# Patient Record
Sex: Male | Born: 1985
Health system: Southern US, Community
[De-identification: ages and names within clinical notes are randomized; demographics above are authoritative.]

## PROBLEM LIST (undated history)

## (undated) DIAGNOSIS — F429 Obsessive-compulsive disorder, unspecified: Secondary | ICD-10-CM

## (undated) HISTORY — DX: Obsessive-compulsive disorder, unspecified: F42.9

## (undated) HISTORY — PX: EYE MUSCLE SURGERY: SHX370

## (undated) HISTORY — PX: WISDOM TOOTH EXTRACTION: SHX21

---

## 2013-10-26 LAB — TSH: TSH: 1.1 u[IU]/mL (ref <=5.90)

## 2013-10-26 LAB — LIPID PANEL
Cholesterol: 198 mg/dL (ref 0–200)
HDL: 50 mg/dL (ref 35–70)
LDL Cholesterol: 130 mg/dL
TRIGLYCERIDES: 92 mg/dL (ref 40–160)

## 2013-10-26 LAB — CBC AND DIFFERENTIAL: HEMOGLOBIN: 15.9 g/dL (ref 13.5–17.5)

## 2014-09-15 ENCOUNTER — Encounter: Payer: Self-pay | Admitting: Internal Medicine

## 2014-09-15 ENCOUNTER — Other Ambulatory Visit: Payer: Self-pay | Admitting: Internal Medicine

## 2014-11-19 ENCOUNTER — Encounter: Payer: Self-pay | Admitting: Internal Medicine

## 2014-11-19 DIAGNOSIS — E7800 Pure hypercholesterolemia, unspecified: Secondary | ICD-10-CM | POA: Insufficient documentation

## 2014-11-19 DIAGNOSIS — Z8659 Personal history of other mental and behavioral disorders: Secondary | ICD-10-CM | POA: Insufficient documentation

## 2015-03-02 ENCOUNTER — Other Ambulatory Visit: Payer: Self-pay | Admitting: Internal Medicine

## 2015-03-03 NOTE — Telephone Encounter (Signed)
pts coming in on 06/04/15 for cpe.

## 2015-03-26 ENCOUNTER — Other Ambulatory Visit: Payer: Self-pay | Admitting: Internal Medicine

## 2015-03-26 ENCOUNTER — Telehealth: Payer: Self-pay

## 2015-03-26 NOTE — Telephone Encounter (Signed)
Left VM needing refill Zoloft 90 to [facility]. Per Asencion Partridge this has been sent in on Mar 02 2015. I advised patient and he said he must have been confused and he will call them and see why he has not gotten these. He will also call back if there is a reason to call in to local.

## 2015-04-18 ENCOUNTER — Encounter: Payer: Self-pay | Admitting: Unknown Physician Specialty

## 2015-04-18 ENCOUNTER — Other Ambulatory Visit: Payer: Self-pay | Admitting: Unknown Physician Specialty

## 2015-04-18 ENCOUNTER — Ambulatory Visit (INDEPENDENT_AMBULATORY_CARE_PROVIDER_SITE_OTHER): Payer: 59 | Admitting: Unknown Physician Specialty

## 2015-04-18 VITALS — BP 139/90 | HR 71 | Temp 98.1°F | Ht 71.0 in | Wt 225.2 lb

## 2015-04-18 DIAGNOSIS — L03011 Cellulitis of right finger: Secondary | ICD-10-CM

## 2015-04-18 DIAGNOSIS — R05 Cough: Secondary | ICD-10-CM | POA: Diagnosis not present

## 2015-04-18 DIAGNOSIS — R059 Cough, unspecified: Secondary | ICD-10-CM

## 2015-04-18 DIAGNOSIS — J069 Acute upper respiratory infection, unspecified: Secondary | ICD-10-CM | POA: Diagnosis not present

## 2015-04-18 NOTE — Progress Notes (Signed)
BP 139/90 mmHg  Pulse 71  Temp(Src) 98.1 F (36.7 C)  Ht 5\' 11"  (1.803 m)  Wt 225 lb 3.2 oz (102.15 kg)  BMI 31.42 kg/m2  SpO2 98%   Subjective:    Patient ID: Kevin Spears, male    DOB: 20-Sep-1985, 30 y.o.   MRN: [id number]  HPI: Kevin Spears is a 30 y.o. male  No chief complaint on file.  URI  This is a new problem. The current episode started today. There has been no fever. Associated symptoms include congestion, headaches, rhinorrhea, sneezing and a sore throat. Pertinent negatives include no chest pain, coughing, neck pain, sinus pain or wheezing. He has tried nothing for the symptoms. The treatment provided no relief.   Paronychia 2 days of redness and swelling of left index finger.  Started Doxycycline 2 days ago.     Relevant past medical, surgical, family and social history reviewed and updated as indicated. Interim medical history since our last visit reviewed. Allergies and medications reviewed and updated.  Review of Systems  HENT: Positive for congestion, rhinorrhea, sneezing and sore throat.   Respiratory: Negative for cough and wheezing.   Cardiovascular: Negative for chest pain.  Musculoskeletal: Negative for neck pain.  Neurological: Positive for headaches.    Per HPI unless specifically indicated above     Objective:    BP 139/90 mmHg  Pulse 71  Temp(Src) 98.1 F (36.7 C)  Ht 5\' 11"  (1.803 m)  Wt 225 lb 3.2 oz (102.15 kg)  BMI 31.42 kg/m2  SpO2 98%  Wt Readings from Last 3 Encounters:  04/18/15 225 lb 3.2 oz (102.15 kg)    Physical Exam  Constitutional: He is oriented to person, place, and time. He appears well-developed and well-nourished. No distress.  HENT:  Head: Normocephalic and atraumatic.  Right Ear: Tympanic membrane and ear canal normal.  Left Ear: Tympanic membrane and ear canal normal.  Nose: Rhinorrhea present. Right sinus exhibits no maxillary sinus tenderness and no frontal sinus tenderness. Left sinus exhibits no  maxillary sinus tenderness and no frontal sinus tenderness.  Mouth/Throat: Uvula is midline. Posterior oropharyngeal edema present.  Eyes: Conjunctivae and lids are normal. Right eye exhibits no discharge. Left eye exhibits no discharge. No scleral icterus.  Neck: Neck supple.  Cardiovascular: Normal rate, regular rhythm and normal heart sounds.   Pulmonary/Chest: Effort normal and breath sounds normal. No respiratory distress.  Abdominal: Normal appearance. There is no splenomegaly or hepatomegaly.  Musculoskeletal: Normal range of motion.  Neurological: He is alert and oriented to person, place, and time.  Skin: Skin is warm, dry and intact. No rash noted. No pallor.  Psychiatric: He has a normal mood and affect. His behavior is normal. Judgment and thought content normal.  Nursing note and vitals reviewed.  Flu is negative  Right index finger with redness and swelling around nail.  Area infiltrated with Lidocaine and small incision made.  Small amount of pustular material expressed.    Results for orders placed or performed in visit on 09/15/14  CBC and differential  Result Value Ref Range   Hemoglobin 15.9 13.5 - 17.5 g/dL  Lipid panel  Result Value Ref Range   Triglycerides 92 40 - 160 mg/dL   Cholesterol 119198 0 - 147200 mg/dL   HDL 50 35 - 70 mg/dL   LDL Cholesterol 829130 mg/dL  TSH  Result Value Ref Range   TSH 1.10 .41 - 5.90 uIU/mL      Assessment & Plan:  Problem List Items Addressed This Visit    None    Visit Diagnoses    Cough    -  Primary    Relevant Orders    Influenza A & B (STAT)    URI (upper respiratory infection)        Supportive care    Paronychia, right        Continue Doxycycline for 2 days and DC if finger improving        Follow up plan: Return if symptoms worsen or fail to improve.

## 2015-04-19 LAB — VERITOR FLU A/B WAIVED
INFLUENZA A: NEGATIVE
Influenza B: NEGATIVE

## 2015-05-26 ENCOUNTER — Other Ambulatory Visit: Payer: Self-pay | Admitting: Internal Medicine

## 2015-06-04 ENCOUNTER — Ambulatory Visit (INDEPENDENT_AMBULATORY_CARE_PROVIDER_SITE_OTHER): Payer: 59 | Admitting: Internal Medicine

## 2015-06-04 ENCOUNTER — Encounter: Payer: Self-pay | Admitting: Internal Medicine

## 2015-06-04 VITALS — BP 120/84 | HR 69 | Temp 98.1°F | Resp 16 | Ht 71.0 in | Wt 224.8 lb

## 2015-06-04 DIAGNOSIS — Z0000 Encounter for general adult medical examination without abnormal findings: Secondary | ICD-10-CM

## 2015-06-04 DIAGNOSIS — Z8659 Personal history of other mental and behavioral disorders: Secondary | ICD-10-CM | POA: Diagnosis not present

## 2015-06-04 DIAGNOSIS — E7800 Pure hypercholesterolemia, unspecified: Secondary | ICD-10-CM

## 2015-06-04 LAB — POCT URINALYSIS DIPSTICK
Bilirubin, UA: NEGATIVE
Blood, UA: NEGATIVE
Glucose, UA: NEGATIVE
Ketones, UA: NEGATIVE
Leukocytes, UA: NEGATIVE
Nitrite, UA: NEGATIVE
PH UA: 5
Protein, UA: NEGATIVE
Spec Grav, UA: 1.01
Urobilinogen, UA: 0.2

## 2015-06-04 NOTE — Patient Instructions (Addendum)
Fat and Cholesterol Restricted Diet High levels of fat and cholesterol in your blood may lead to various health problems, such as diseases of the heart, blood vessels, gallbladder, liver, and pancreas. Fats are concentrated sources of energy that come in various forms. Certain types of fat, including saturated fat, may be harmful in excess. Cholesterol is a substance needed by your body in small amounts. Your body makes all the cholesterol it needs. Excess cholesterol comes from the food you eat. When you have high levels of cholesterol and saturated fat in your blood, health problems can develop because the excess fat and cholesterol will gather along the walls of your blood vessels, causing them to narrow. Choosing the right foods will help you control your intake of fat and cholesterol. This will help keep the levels of these substances in your blood within normal limits and reduce your risk of disease. WHAT IS MY PLAN? Your health care provider recommends that you:  Get no more than ______30____ % of the total calories in your daily diet from fat.  Limit your intake of saturated fat to less than __15____% of your total calories each day.  Limit the amount of cholesterol in your diet to less than ____NA_____mg per day. WHAT TYPES OF FAT SHOULD I CHOOSE?  Choose healthy fats more often. Choose monounsaturated and polyunsaturated fats, such as olive and canola oil, flaxseeds, walnuts, almonds, and seeds.  Eat more omega-3 fats. Good choices include salmon, mackerel, sardines, tuna, flaxseed oil, and ground flaxseeds. Aim to eat fish at least two times a week.  Limit saturated fats. Saturated fats are primarily found in animal products, such as meats, butter, and cream. Plant sources of saturated fats include palm oil, palm kernel oil, and coconut oil.  Avoid foods with partially hydrogenated oils in them. These contain trans fats. Examples of foods that contain trans fats are stick margarine,  some tub margarines, cookies, crackers, and other baked goods. WHAT GENERAL GUIDELINES DO I NEED TO FOLLOW? These guidelines for healthy eating will help you control your intake of fat and cholesterol:  Check food labels carefully to identify foods with trans fats or high amounts of saturated fat.  Fill one half of your plate with vegetables and green salads.  Fill one fourth of your plate with whole grains. Look for the word "whole" as the first word in the ingredient list.  Fill one fourth of your plate with lean protein foods.  Limit fruit to two servings a day. Choose fruit instead of juice.  Eat more foods that contain soluble fiber. Examples of foods that contain this type of fiber are apples, broccoli, carrots, beans, peas, and barley. Aim to get 20-30 g of fiber per day.  Eat more home-cooked food and less restaurant, buffet, and fast food.  Limit or avoid alcohol.  Limit foods high in starch and sugar.  Limit fried foods.  Cook foods using methods other than frying. Baking, boiling, grilling, and broiling are all great options.  Lose weight if you are overweight. Losing just 5-10% of your initial body weight can help your overall health and prevent diseases such as diabetes and heart disease. WHAT FOODS CAN I EAT? Grains Whole grains, such as whole wheat or whole grain breads, crackers, cereals, and pasta. Unsweetened oatmeal, bulgur, barley, quinoa, or brown rice. Corn or whole wheat flour tortillas. Vegetables Fresh or frozen vegetables (raw, steamed, roasted, or grilled). Green salads. Fruits All fresh, canned (in natural juice), or frozen fruits. Meat and  Other Protein Products Ground beef (85% or leaner), grass-fed beef, or beef trimmed of fat. Skinless chicken or turkey. Ground chicken or turkey. Pork trimmed of fat. All fish and seafood. Eggs. Dried beans, peas, or lentils. Unsalted nuts or seeds. Unsalted canned or dry beans. Dairy Low-fat dairy products, such as  skim or 1% milk, 2% or reduced-fat cheeses, low-fat ricotta or cottage cheese, or plain low-fat yogurt. Fats and Oils Tub margarines without trans fats. Light or reduced-fat mayonnaise and salad dressings. Avocado. Olive, canola, sesame, or safflower oils. Natural peanut or almond butter (choose ones without added sugar and oil). The items listed above may not be a complete list of recommended foods or beverages. Contact your dietitian for more options. WHAT FOODS ARE NOT RECOMMENDED? Grains White bread. White pasta. White rice. Cornbread. Bagels, pastries, and croissants. Crackers that contain trans fat. Vegetables White potatoes. Corn. Creamed or fried vegetables. Vegetables in a cheese sauce. Fruits Dried fruits. Canned fruit in light or heavy syrup. Fruit juice. Meat and Other Protein Products Fatty cuts of meat. Ribs, chicken wings, bacon, sausage, bologna, salami, chitterlings, fatback, hot dogs, bratwurst, and packaged luncheon meats. Liver and organ meats. Dairy Whole or 2% milk, cream, half-and-half, and cream cheese. Whole milk cheeses. Whole-fat or sweetened yogurt. Full-fat cheeses. Nondairy creamers and whipped toppings. Processed cheese, cheese spreads, or cheese curds. Sweets and Desserts Corn syrup, sugars, honey, and molasses. Candy. Jam and jelly. Syrup. Sweetened cereals. Cookies, pies, cakes, donuts, muffins, and ice cream. Fats and Oils Butter, stick margarine, lard, shortening, ghee, or bacon fat. Coconut, palm kernel, or palm oils. Beverages Alcohol. Sweetened drinks (such as sodas, lemonade, and fruit drinks or punches). The items listed above may not be a complete list of foods and beverages to avoid. Contact your dietitian for more information.   This information is not intended to replace advice given to you by your health care provider. Make sure you discuss any questions you have with your health care provider.   Document Released: 01/11/2005 Document Revised:  02/01/2014 Document Reviewed: 04/11/2013 Elsevier Interactive Patient Education 2016 Elsevier Inc.  

## 2015-06-04 NOTE — Progress Notes (Signed)
Date:  06/04/2015   Name:  Kevin Spears   DOB:  [birth year]   MRN:  [id number]   Chief Complaint: Annual Exam Kevin Spears is a 30 y.o. male who presents today for his Complete Annual Exam. He feels well. He reports exercising regularly doing cardio and weight lifting. He reports he is sleeping fairly well.   OCD - he is doing very well on Sertraline daily.  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  He has been able to concentrate and complete work in a timely fashion.  He would like to continue the medication.   Review of Systems  Constitutional: Negative for fever, chills and unexpected weight change.  HENT: Negative for hearing loss.   Eyes: Negative for visual disturbance.  Respiratory: Negative for cough, chest tightness and shortness of breath.   Cardiovascular: Negative for chest pain, palpitations and leg swelling.  Gastrointestinal: Negative for abdominal pain, diarrhea, constipation and blood in stool.  Genitourinary: Negative for dysuria, urgency, hematuria, scrotal swelling and testicular pain.  Musculoskeletal: Negative for arthralgias.  Skin: Negative for color change and rash.  Allergic/Immunologic: Positive for environmental allergies.  Neurological: Negative for dizziness, tremors, weakness, numbness and headaches.  Hematological: Negative for adenopathy.  Psychiatric/Behavioral: Negative for sleep disturbance, dysphoric mood and decreased concentration.    Patient Active Problem List   Diagnosis Date Noted  . Elevated low-density lipoprotein level 11/19/2014  . H/O mental disorder 11/19/2014    Prior to Admission medications   Medication Sig Start Date End Date Taking? Authorizing Provider  sertraline (ZOLOFT) 100 MG tablet Take 1 tablet by mouth  daily 05/26/15  Yes Reubin Milan, MD    Allergies  Allergen Reactions  . Erythromycin Rash  . Penicillins Rash    Past Surgical History  Procedure Laterality Date  . Eye muscle surgery    .  Wisdom tooth extraction      Social History  Substance Use Topics  . Smoking status: Former Smoker    Types: Cigars  . Smokeless tobacco: None  . Alcohol Use: 3.6 oz/week    4 Standard drinks or equivalent, 2 Cans of beer per week   Medication list has been reviewed and updated.  Physical Exam  Constitutional: He is oriented to person, place, and time. He appears well-developed and well-nourished. No distress.  HENT:  Head: Normocephalic and atraumatic.  Right Ear: Tympanic membrane, external ear and ear canal normal.  Left Ear: Tympanic membrane, external ear and ear canal normal.  Nose: Nose normal.  Mouth/Throat: Uvula is midline and oropharynx is clear and moist.  Eyes: Conjunctivae and EOM are normal. Pupils are equal, round, and reactive to light.  Neck: Normal range of motion. Neck supple. Carotid bruit is not present. No thyromegaly present.  Cardiovascular: Normal rate, regular rhythm, normal heart sounds and intact distal pulses.   Pulmonary/Chest: Effort normal and breath sounds normal. No respiratory distress. He has no wheezes. Right breast exhibits no mass. Left breast exhibits no mass.  Abdominal: Soft. Normal appearance and bowel sounds are normal. There is no hepatosplenomegaly. There is no tenderness.  Musculoskeletal: Normal range of motion. He exhibits no edema or tenderness.  Lymphadenopathy:    He has no cervical adenopathy.  Neurological: He is alert and oriented to person, place, and time. He has normal reflexes.  Skin: Skin is warm, dry and intact. No rash noted.  Psychiatric: He has a normal mood and affect. His speech is normal and behavior is  normal. Judgment and thought content normal.  Nursing note and vitals reviewed.   BP 120/84 mmHg  Pulse 69  Temp(Src) 98.1 F (36.7 C) (Oral)  Resp 16  Ht 5\' 11"  (1.803 m)  Wt 224 lb 12.8 oz (101.969 kg)  BMI 31.37 kg/m2  SpO2 98%  Assessment and Plan: 1. Annual physical exam Continue regular exercise  and weight control - CBC with Differential/Platelet - Comprehensive metabolic panel - POCT urinalysis dipstick  2. Elevated low-density lipoprotein level Recommend low fat diet; will advise if medication is needed - Lipid panel  3. History of OCD (obsessive compulsive disorder) Continue Sertraline - TSH   Bari EdwardLaura Berglund, MD [facility]  06/04/2015

## 2015-06-05 LAB — CBC WITH DIFFERENTIAL/PLATELET
BASOS: 0 %
Basophils Absolute: 0 10*3/uL (ref 0.0–0.2)
EOS (ABSOLUTE): 0.1 10*3/uL (ref 0.0–0.4)
EOS: 1 %
HEMATOCRIT: 43.3 % (ref 37.5–51.0)
HEMOGLOBIN: 14.8 g/dL (ref 12.6–17.7)
IMMATURE GRANULOCYTES: 0 %
Immature Grans (Abs): 0 10*3/uL (ref 0.0–0.1)
LYMPHS ABS: 2.2 10*3/uL (ref 0.7–3.1)
Lymphs: 25 %
MCH: 30 pg (ref 26.6–33.0)
MCHC: 34.2 g/dL (ref 31.5–35.7)
MCV: 88 fL (ref 79–97)
MONOCYTES: 8 %
MONOS ABS: 0.7 10*3/uL (ref 0.1–0.9)
Neutrophils Absolute: 5.8 10*3/uL (ref 1.4–7.0)
Neutrophils: 66 %
Platelets: 267 10*3/uL (ref 150–379)
RBC: 4.93 x10E6/uL (ref 4.14–5.80)
RDW: 13.3 % (ref 12.3–15.4)
WBC: 8.8 10*3/uL (ref 3.4–10.8)

## 2015-06-05 LAB — COMPREHENSIVE METABOLIC PANEL
A/G RATIO: 1.5 (ref 1.2–2.2)
ALBUMIN: 4.6 g/dL (ref 3.5–5.5)
ALT: 31 IU/L (ref 0–44)
AST: 30 IU/L (ref 0–40)
Alkaline Phosphatase: 106 IU/L (ref 39–117)
BUN / CREAT RATIO: 12 (ref 9–20)
BUN: 12 mg/dL (ref 6–20)
Bilirubin Total: 0.5 mg/dL (ref 0.0–1.2)
CALCIUM: 9.5 mg/dL (ref 8.7–10.2)
CO2: 25 mmol/L (ref 18–29)
CREATININE: 1.01 mg/dL (ref 0.76–1.27)
Chloride: 97 mmol/L (ref 96–106)
GFR, EST AFRICAN AMERICAN: 113 mL/min/{1.73_m2} (ref >59)
GFR, EST NON AFRICAN AMERICAN: 98 mL/min/{1.73_m2} (ref >59)
GLOBULIN, TOTAL: 3 g/dL (ref 1.5–4.5)
Glucose: 84 mg/dL (ref 65–99)
POTASSIUM: 4.5 mmol/L (ref 3.5–5.2)
SODIUM: 139 mmol/L (ref 134–144)
Total Protein: 7.6 g/dL (ref 6.0–8.5)

## 2015-06-05 LAB — LIPID PANEL
CHOLESTEROL TOTAL: 193 mg/dL (ref 100–199)
Chol/HDL Ratio: 4 ratio units (ref 0.0–5.0)
HDL: 48 mg/dL (ref >39)
LDL CALC: 121 mg/dL — AB (ref 0–99)
TRIGLYCERIDES: 121 mg/dL (ref 0–149)
VLDL CHOLESTEROL CAL: 24 mg/dL (ref 5–40)

## 2015-06-05 LAB — TSH: TSH: 1.94 u[IU]/mL (ref 0.450–4.500)

## 2015-11-17 ENCOUNTER — Ambulatory Visit (INDEPENDENT_AMBULATORY_CARE_PROVIDER_SITE_OTHER): Payer: 59

## 2015-11-17 DIAGNOSIS — Z23 Encounter for immunization: Secondary | ICD-10-CM | POA: Diagnosis not present

## 2015-12-03 ENCOUNTER — Other Ambulatory Visit: Payer: Self-pay | Admitting: Family Medicine

## 2015-12-03 MED ORDER — DOXYCYCLINE HYCLATE 100 MG PO TABS: 100.0000 mg | ORAL_TABLET | Freq: Two times a day (BID) | ORAL | 0 refills | Status: DC

## 2016-01-13 ENCOUNTER — Telehealth: Payer: Self-pay | Admitting: Internal Medicine

## 2016-01-13 ENCOUNTER — Other Ambulatory Visit: Payer: Self-pay | Admitting: Internal Medicine

## 2016-01-13 MED ORDER — SERTRALINE HCL 100 MG PO TABS: 100.0000 mg | ORAL_TABLET | Freq: Every day | ORAL | 0 refills | Status: DC

## 2016-01-13 NOTE — Telephone Encounter (Signed)
Pt called stated Rx. Sertraline have automatic refill and is not  enough time due to going out of town this week need to send the medication to this Pharmacy ---[facility] --[place], [place]

## 2016-03-08 ENCOUNTER — Other Ambulatory Visit: Payer: Self-pay | Admitting: Family Medicine

## 2016-03-08 MED ORDER — ONDANSETRON 4 MG PO TBDP: 4.0000 mg | ORAL_TABLET | Freq: Three times a day (TID) | ORAL | 0 refills | Status: DC | PRN

## 2016-03-11 ENCOUNTER — Other Ambulatory Visit: Payer: Self-pay | Admitting: Internal Medicine

## 2016-03-11 MED ORDER — SERTRALINE HCL 100 MG PO TABS: 100.0000 mg | ORAL_TABLET | Freq: Every day | ORAL | 3 refills | Status: DC

## 2016-07-09 DIAGNOSIS — M25522 Pain in left elbow: Secondary | ICD-10-CM | POA: Diagnosis not present

## 2017-01-31 ENCOUNTER — Encounter: Payer: Self-pay | Admitting: Internal Medicine

## 2017-01-31 ENCOUNTER — Other Ambulatory Visit: Payer: Self-pay | Admitting: Internal Medicine

## 2017-01-31 ENCOUNTER — Ambulatory Visit: Payer: 59 | Admitting: Internal Medicine

## 2017-01-31 VITALS — BP 112/70 | HR 67 | Temp 97.7°F | Ht 71.0 in | Wt 235.0 lb

## 2017-01-31 DIAGNOSIS — J0100 Acute maxillary sinusitis, unspecified: Secondary | ICD-10-CM

## 2017-01-31 MED ORDER — AZITHROMYCIN 250 MG PO TABS: ORAL_TABLET | ORAL | 0 refills | Status: DC

## 2017-01-31 NOTE — Progress Notes (Signed)
Date:  01/31/2017   Name:  Kevin Spears   DOB:  [birth year]   MRN:  [id number]   Chief Complaint: Nasal Congestion (Started over 2 weeks ago. Thought was allergies. Worse last few days. Sore throat, sinus headaches. Yellow mucous coming out. ) and Chest Pain (Dropped weight on left front rib area when working out Friday. Now has painful bruise. Wanted to get checked out. )  Sinusitis  This is a new problem. The current episode started 1 to 4 weeks ago. The problem has been gradually worsening since onset. There has been no fever. Associated symptoms include congestion, coughing, sinus pressure and a sore throat. Pertinent negatives include no chills.   Chest wall pain - dropped a weight bar on his left lower ribs over the weekend.  Slightly tender but no shortness of breath.  No cough or wheezing.  Review of Systems  Constitutional: Negative for chills, fatigue and fever.  HENT: Positive for congestion, postnasal drip, sinus pressure, sinus pain and sore throat. Negative for ear discharge.   Eyes: Negative for visual disturbance.  Respiratory: Positive for cough. Negative for chest tightness and wheezing.   Cardiovascular: Negative for chest pain and palpitations.       Rib pain     Patient Active Problem List   Diagnosis Date Noted  . Elevated low-density lipoprotein level 11/19/2014  . History of OCD (obsessive compulsive disorder) 11/19/2014    Prior to Admission medications   Medication Sig Start Date End Date Taking? Authorizing Provider  sertraline (ZOLOFT) 100 MG tablet Take 1 tablet (100 mg total) by mouth daily. 03/11/16  Yes Reubin MilanBerglund, Aneita Kiger H, MD    Allergies  Allergen Reactions  . Erythromycin Rash  . Penicillins Rash    Past Surgical History:  Procedure Laterality Date  . EYE MUSCLE SURGERY    . WISDOM TOOTH EXTRACTION      Social History   Tobacco Use  . Smoking status: Former Smoker    Types: Cigars  . Smokeless tobacco: Never Used  Substance Use  Topics  . Alcohol use: Yes    Alcohol/week: 3.6 oz    Types: 2 Cans of beer, 4 Standard drinks or equivalent per week  . Drug use: No     Medication list has been reviewed and updated.  PHQ 2/9 Scores 01/31/2017 06/04/2015  PHQ - 2 Score 0 0    Physical Exam  Constitutional: He is oriented to person, place, and time. He appears well-developed and well-nourished.  HENT:  Nose: Right sinus exhibits maxillary sinus tenderness and frontal sinus tenderness. Left sinus exhibits maxillary sinus tenderness and frontal sinus tenderness.  Mouth/Throat: Uvula is midline and mucous membranes are normal. No oral lesions. Posterior oropharyngeal erythema present. No oropharyngeal exudate.  TMs obscured by cerumen  Cardiovascular: Normal rate, regular rhythm and normal heart sounds.  Pulmonary/Chest: Breath sounds normal. He has no wheezes. He has no rhonchi. He has no rales.    Lymphadenopathy:    He has no cervical adenopathy.  Neurological: He is alert and oriented to person, place, and time.    BP 112/70   Pulse 67   Temp 97.7 F (36.5 C) (Oral)   Ht 5\' 11"  (1.803 m)   Wt 235 lb (106.6 kg)   SpO2 98%   BMI 32.78 kg/m   Assessment and Plan: 1. Acute non-recurrent maxillary sinusitis Continue decongestants and increase fluids - azithromycin (ZITHROMAX Z-PAK) 250 MG tablet; UAD  Dispense: 6 each; Refill: 0  Meds ordered this encounter  Medications  . azithromycin (ZITHROMAX Z-PAK) 250 MG tablet    Sig: UAD    Dispense:  6 each    Refill:  0    Partially dictated using Animal nutritionist. Any errors are unintentional.  Bari Edward, MD [facility]  01/31/2017

## 2017-01-31 NOTE — Patient Instructions (Signed)

## 2017-02-22 ENCOUNTER — Ambulatory Visit (INDEPENDENT_AMBULATORY_CARE_PROVIDER_SITE_OTHER): Payer: 59 | Admitting: Internal Medicine

## 2017-02-22 ENCOUNTER — Encounter: Payer: Self-pay | Admitting: Internal Medicine

## 2017-02-22 VITALS — BP 118/78 | HR 61 | Ht 71.0 in | Wt 231.0 lb

## 2017-02-22 DIAGNOSIS — Z0001 Encounter for general adult medical examination with abnormal findings: Secondary | ICD-10-CM | POA: Diagnosis not present

## 2017-02-22 DIAGNOSIS — E7800 Pure hypercholesterolemia, unspecified: Secondary | ICD-10-CM

## 2017-02-22 DIAGNOSIS — F429 Obsessive-compulsive disorder, unspecified: Secondary | ICD-10-CM | POA: Diagnosis not present

## 2017-02-22 DIAGNOSIS — Z0000 Encounter for general adult medical examination without abnormal findings: Secondary | ICD-10-CM

## 2017-02-22 LAB — POCT URINALYSIS DIPSTICK
BILIRUBIN UA: NEGATIVE
GLUCOSE UA: NEGATIVE
Ketones, UA: NEGATIVE
LEUKOCYTES UA: NEGATIVE
Nitrite, UA: NEGATIVE
Protein, UA: NEGATIVE
Spec Grav, UA: 1.01 (ref 1.010–1.025)
Urobilinogen, UA: 0.2 E.U./dL
pH, UA: 6.5 (ref 5.0–8.0)

## 2017-02-22 MED ORDER — SERTRALINE HCL 100 MG PO TABS: 100.0000 mg | ORAL_TABLET | Freq: Every day | ORAL | 3 refills | Status: DC

## 2017-02-22 NOTE — Patient Instructions (Signed)

## 2017-02-22 NOTE — Addendum Note (Signed)
Addended by: Reubin MilanBERGLUND, LAURA H on: 02/22/2017 11:38 AM   Modules accepted: Orders

## 2017-02-22 NOTE — Progress Notes (Signed)
Date:  02/22/2017   Name:  Kevin Spears   DOB:  Nov 20, 1985   MRN:  [id number]   Chief Complaint: Annual Exam Kevin Spears is a 32 y.o. male who presents today for his Complete Annual Exam. He feels well. He reports exercising going to the gym and biking some. He reports he is sleeping well.  He is leaving his current job. 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  OCD - doing well with good control of sx on Zoloft 100 mg.  No side effects, change in appetite or weight.   Review of Systems  Constitutional: Negative for appetite change, chills, diaphoresis, fatigue and unexpected weight change.  HENT: Negative for hearing loss, tinnitus, trouble swallowing and voice change.   Eyes: Negative for visual disturbance.  Respiratory: Negative for choking, shortness of breath and wheezing.   Cardiovascular: Negative for chest pain, palpitations and leg swelling.  Gastrointestinal: Negative for abdominal pain, blood in stool, constipation and diarrhea.  Genitourinary: Negative for difficulty urinating, dysuria and frequency.  Musculoskeletal: Negative for arthralgias, back pain and myalgias.  Skin: Negative for color change and rash.  Neurological: Positive for headaches (intermittent). Negative for dizziness and syncope.  Hematological: Negative for adenopathy.  Psychiatric/Behavioral: Negative for dysphoric mood and sleep disturbance. The patient is not nervous/anxious.     Patient Active Problem List   Diagnosis Date Noted  . Elevated low-density lipoprotein level 11/19/2014  . History of OCD (obsessive compulsive disorder) 11/19/2014    Prior to Admission medications   Medication Sig Start Date End Date Taking? Authorizing Provider  sertraline (ZOLOFT) 100 MG tablet Take 1 tablet (100 mg total) by mouth daily. 03/11/16  Yes Reubin MilanBerglund, Zuriel Yeaman H, MD    Allergies  Allergen Reactions  . Erythromycin Rash  . Penicillins Rash    Past Surgical History:  Procedure Laterality Date  .  EYE MUSCLE SURGERY    . WISDOM TOOTH EXTRACTION      Social History   Tobacco Use  . Smoking status: Former Smoker    Types: Cigars  . Smokeless tobacco: Never Used  Substance Use Topics  . Alcohol use: Yes    Alcohol/week: 3.6 oz    Types: 2 Cans of beer, 4 Standard drinks or equivalent per week  . Drug use: No     Medication list has been reviewed and updated.  PHQ 2/9 Scores 02/22/2017 01/31/2017 06/04/2015  PHQ - 2 Score 0 0 0    Physical Exam  Constitutional: He is oriented to person, place, and time. He appears well-developed and well-nourished.  HENT:  Head: Normocephalic.  Right Ear: Tympanic membrane, external ear and ear canal normal.  Left Ear: Tympanic membrane, external ear and ear canal normal.  Nose: Nose normal.  Mouth/Throat: Uvula is midline and oropharynx is clear and moist.  Eyes: Conjunctivae and EOM are normal. Pupils are equal, round, and reactive to light.  Neck: Normal range of motion. Neck supple. Carotid bruit is not present. No thyromegaly present.  Cardiovascular: Normal rate, regular rhythm, normal heart sounds and intact distal pulses.  Pulmonary/Chest: Effort normal and breath sounds normal. He has no wheezes. Right breast exhibits no mass. Left breast exhibits no mass.  Abdominal: Soft. Normal appearance and bowel sounds are normal. There is no hepatosplenomegaly. There is no tenderness.  Musculoskeletal: Normal range of motion.  Lymphadenopathy:    He has no cervical adenopathy.  Neurological: He is alert and oriented to person, place, and time. He has normal  reflexes.  Skin: Skin is warm, dry and intact.  Psychiatric: He has a normal mood and affect. His speech is normal and behavior is normal. Judgment and thought content normal.  Nursing note and vitals reviewed.   BP 118/78   Pulse 61   Ht 5\' 11"  (1.803 m)   Wt 231 lb (104.8 kg)   SpO2 97%   BMI 32.22 kg/m   Assessment and Plan: 1. Annual physical exam Normal exam except for  weight Discussed low fat diet and aerobic exercise - CBC with Differential/Platelet - Comprehensive metabolic panel  2. OCD (obsessive compulsive disorder) Doing well on current tx - sertraline (ZOLOFT) 100 MG tablet; Take 1 tablet (100 mg total) by mouth daily.  Dispense: 90 tablet; Refill: 3  3. Elevated low-density lipoprotein level Check and advise - Lipid panel   Meds ordered this encounter  Medications  . sertraline (ZOLOFT) 100 MG tablet    Sig: Take 1 tablet (100 mg total) by mouth daily.    Dispense:  90 tablet    Refill:  3    Partially dictated using Animal nutritionist. Any errors are unintentional.  Bari Edward, MD [facility]  02/22/2017

## 2017-02-23 LAB — CBC WITH DIFFERENTIAL/PLATELET
BASOS ABS: 0 10*3/uL (ref 0.0–0.2)
Basos: 0 %
EOS (ABSOLUTE): 0.1 10*3/uL (ref 0.0–0.4)
Eos: 1 %
HEMATOCRIT: 44.6 % (ref 37.5–51.0)
Hemoglobin: 15.4 g/dL (ref 13.0–17.7)
Immature Grans (Abs): 0 10*3/uL (ref 0.0–0.1)
Immature Granulocytes: 0 %
LYMPHS ABS: 2.3 10*3/uL (ref 0.7–3.1)
Lymphs: 28 %
MCH: 29.8 pg (ref 26.6–33.0)
MCHC: 34.5 g/dL (ref 31.5–35.7)
MCV: 86 fL (ref 79–97)
MONOS ABS: 0.7 10*3/uL (ref 0.1–0.9)
Monocytes: 9 %
Neutrophils Absolute: 5 10*3/uL (ref 1.4–7.0)
Neutrophils: 62 %
PLATELETS: 268 10*3/uL (ref 150–379)
RBC: 5.17 x10E6/uL (ref 4.14–5.80)
RDW: 13.4 % (ref 12.3–15.4)
WBC: 8.1 10*3/uL (ref 3.4–10.8)

## 2017-02-23 LAB — COMPREHENSIVE METABOLIC PANEL
ALK PHOS: 105 IU/L (ref 39–117)
ALT: 38 IU/L (ref 0–44)
AST: 31 IU/L (ref 0–40)
Albumin/Globulin Ratio: 1.6 (ref 1.2–2.2)
Albumin: 4.9 g/dL (ref 3.5–5.5)
BUN/Creatinine Ratio: 14 (ref 9–20)
BUN: 15 mg/dL (ref 6–20)
Bilirubin Total: 0.2 mg/dL (ref 0.0–1.2)
CO2: 21 mmol/L (ref 20–29)
CREATININE: 1.09 mg/dL (ref 0.76–1.27)
Calcium: 8.4 mg/dL — ABNORMAL LOW (ref 8.7–10.2)
Chloride: 101 mmol/L (ref 96–106)
GFR calc Af Amer: 103 mL/min/{1.73_m2} (ref >59)
GFR calc non Af Amer: 89 mL/min/{1.73_m2} (ref >59)
GLOBULIN, TOTAL: 3 g/dL (ref 1.5–4.5)
Glucose: 78 mg/dL (ref 65–99)
Potassium: 4.5 mmol/L (ref 3.5–5.2)
SODIUM: 142 mmol/L (ref 134–144)
Total Protein: 7.9 g/dL (ref 6.0–8.5)

## 2017-02-23 LAB — LIPID PANEL
CHOLESTEROL TOTAL: 214 mg/dL — AB (ref 100–199)
Chol/HDL Ratio: 3.8 ratio (ref 0.0–5.0)
HDL: 56 mg/dL (ref >39)
LDL CALC: 141 mg/dL — AB (ref 0–99)
TRIGLYCERIDES: 84 mg/dL (ref 0–149)
VLDL Cholesterol Cal: 17 mg/dL (ref 5–40)

## 2017-03-17 DIAGNOSIS — J209 Acute bronchitis, unspecified: Secondary | ICD-10-CM | POA: Diagnosis not present

## 2017-03-17 DIAGNOSIS — J069 Acute upper respiratory infection, unspecified: Secondary | ICD-10-CM | POA: Diagnosis not present

## 2017-06-29 ENCOUNTER — Other Ambulatory Visit: Payer: Self-pay | Admitting: Internal Medicine

## 2017-10-07 ENCOUNTER — Ambulatory Visit (INDEPENDENT_AMBULATORY_CARE_PROVIDER_SITE_OTHER): Payer: 59

## 2017-10-07 DIAGNOSIS — Z23 Encounter for immunization: Secondary | ICD-10-CM | POA: Diagnosis not present

## 2017-10-19 ENCOUNTER — Other Ambulatory Visit: Payer: Self-pay | Admitting: Family Medicine

## 2017-10-19 DIAGNOSIS — Z0184 Encounter for antibody response examination: Secondary | ICD-10-CM

## 2017-10-21 ENCOUNTER — Other Ambulatory Visit: Payer: 59

## 2017-10-21 DIAGNOSIS — Z0184 Encounter for antibody response examination: Secondary | ICD-10-CM

## 2017-10-22 LAB — MEASLES/MUMPS/RUBELLA IMMUNITY
MUMPS ABS, IGG: 191 AU/mL (ref >10.9)
RUBEOLA AB, IGG: <13.5 AU/mL — ABNORMAL LOW (ref >16.4)
Rubella Antibodies, IGG: 12.3 index (ref >0.99)

## 2018-02-10 ENCOUNTER — Ambulatory Visit (INDEPENDENT_AMBULATORY_CARE_PROVIDER_SITE_OTHER): Payer: 59 | Admitting: Family Medicine

## 2018-02-10 ENCOUNTER — Encounter: Payer: Self-pay | Admitting: Family Medicine

## 2018-02-10 VITALS — BP 130/84 | HR 84 | Temp 97.8°F

## 2018-02-10 DIAGNOSIS — J069 Acute upper respiratory infection, unspecified: Secondary | ICD-10-CM

## 2018-02-10 DIAGNOSIS — R5383 Other fatigue: Secondary | ICD-10-CM | POA: Diagnosis not present

## 2018-02-10 LAB — VERITOR FLU A/B WAIVED
INFLUENZA B: NEGATIVE
Influenza A: NEGATIVE

## 2018-02-10 NOTE — Progress Notes (Signed)
   BP 130/84   Pulse 84   Temp 97.8 F (36.6 C) (Oral)   SpO2 97%    Subjective:    Patient ID: Kevin Spears, male    DOB: 04-22-1985, 33 y.o.   MRN: [id number]  HPI: Kevin Spears is a 33 y.o. male  Chief Complaint  Patient presents with  . Sore Throat   Here today with 2 days of fatigue, sore throat, rhinorrhea. Denies known fevers, body aches, cough, ear pain, N/V/D. Has been taking antihistamines with some relief. Works as a professor so lots of sick contacts. No hx of pulmonary dz, does have allergies seasonally. Non smoker.   Relevant past medical, surgical, family and social history reviewed and updated as indicated. Interim medical history since our last visit reviewed. Allergies and medications reviewed and updated.  Review of Systems  Per HPI unless specifically indicated above     Objective:    BP 130/84   Pulse 84   Temp 97.8 F (36.6 C) (Oral)   SpO2 97%   Wt Readings from Last 3 Encounters:  02/22/17 231 lb (104.8 kg)  01/31/17 235 lb (106.6 kg)  06/04/15 224 lb 12.8 oz (102 kg)    Physical Exam Vitals signs and nursing note reviewed.  Constitutional:      Appearance: Normal appearance.  HENT:     Head: Atraumatic.     Nose: Nose normal.     Mouth/Throat:     Mouth: Mucous membranes are moist.     Pharynx: Posterior oropharyngeal erythema present.  Eyes:     Extraocular Movements: Extraocular movements intact.     Conjunctiva/sclera: Conjunctivae normal.  Neck:     Musculoskeletal: Normal range of motion and neck supple.  Cardiovascular:     Rate and Rhythm: Normal rate and regular rhythm.  Pulmonary:     Effort: Pulmonary effort is normal.     Breath sounds: Normal breath sounds.  Musculoskeletal: Normal range of motion.  Skin:    General: Skin is warm and dry.  Neurological:     General: No focal deficit present.     Mental Status: He is oriented to person, place, and time.  Psychiatric:        Mood and Affect: Mood normal.      Thought Content: Thought content normal.        Judgment: Judgment normal.     Results for orders placed or performed in visit on 02/10/18  Veritor Flu A/B Waived  Result Value Ref Range   Influenza A Negative Negative   Influenza B Negative Negative      Assessment & Plan:   Problem List Items Addressed This Visit    None    Visit Diagnoses    Viral URI    -  Primary   Rapid flu neg, supportive care discussed. F/u if worsening or not resolving   Fatigue, unspecified type       Relevant Orders   Veritor Flu A/B Waived (Completed)       Follow up plan: Return if symptoms worsen or fail to improve.

## 2018-02-23 ENCOUNTER — Encounter: Payer: 59 | Admitting: Internal Medicine

## 2018-03-06 ENCOUNTER — Encounter: Payer: 59 | Admitting: Internal Medicine

## 2018-03-13 ENCOUNTER — Ambulatory Visit (INDEPENDENT_AMBULATORY_CARE_PROVIDER_SITE_OTHER): Payer: 59 | Admitting: Internal Medicine

## 2018-03-13 ENCOUNTER — Encounter: Payer: Self-pay | Admitting: Internal Medicine

## 2018-03-13 VITALS — BP 116/74 | HR 62 | Ht 71.0 in | Wt 231.0 lb

## 2018-03-13 DIAGNOSIS — Z0000 Encounter for general adult medical examination without abnormal findings: Secondary | ICD-10-CM | POA: Diagnosis not present

## 2018-03-13 DIAGNOSIS — E7800 Pure hypercholesterolemia, unspecified: Secondary | ICD-10-CM

## 2018-03-13 DIAGNOSIS — J3089 Other allergic rhinitis: Secondary | ICD-10-CM | POA: Insufficient documentation

## 2018-03-13 DIAGNOSIS — Z0184 Encounter for antibody response examination: Secondary | ICD-10-CM

## 2018-03-13 DIAGNOSIS — Z8659 Personal history of other mental and behavioral disorders: Secondary | ICD-10-CM | POA: Diagnosis not present

## 2018-03-13 LAB — POCT URINALYSIS DIPSTICK
Bilirubin, UA: NEGATIVE
Blood, UA: NEGATIVE
Glucose, UA: NEGATIVE
Ketones, UA: NEGATIVE
Leukocytes, UA: NEGATIVE
Nitrite, UA: NEGATIVE
Protein, UA: NEGATIVE
Spec Grav, UA: 1.01 (ref 1.010–1.025)
Urobilinogen, UA: 0.2 E.U./dL
pH, UA: 6 (ref 5.0–8.0)

## 2018-03-13 MED ORDER — SERTRALINE HCL 100 MG PO TABS: 100.0000 mg | ORAL_TABLET | Freq: Every day | ORAL | 3 refills | Status: DC

## 2018-03-13 NOTE — Progress Notes (Signed)
Date:  03/13/2018   Name:  Kevin Spears   DOB:  [birth year]   MRN:  [id number]   Chief Complaint: Annual Exam and MMR (Patient is unsure if his MMR is out of date. Would like titer ran to see if he is still immune or not. ) Kevin Spears is a 33 y.o. male who presents today for his Complete Annual Exam. He feels well. He reports exercising at the gym 6 days a week but no cardio. He reports he is sleeping fairly well.   Hyperlipidemia  Pertinent negatives include no chest pain, myalgias or shortness of breath.   OCD - taking zoloft for years and doing well.  No anxiety or depression sx.  Lab Results  Component Value Date   CHOL 214 (H) 02/22/2017   HDL 56 02/22/2017   LDLCALC 141 (H) 02/22/2017   TRIG 84 02/22/2017   CHOLHDL 3.8 02/22/2017     Review of Systems  Constitutional: Negative for appetite change, chills, diaphoresis, fatigue and unexpected weight change.  HENT: Positive for postnasal drip. Negative for hearing loss, tinnitus, trouble swallowing and voice change.   Eyes: Negative for visual disturbance.  Respiratory: Negative for choking, shortness of breath and wheezing.   Cardiovascular: Negative for chest pain, palpitations and leg swelling.  Gastrointestinal: Negative for abdominal pain, blood in stool, constipation and diarrhea.  Genitourinary: Negative for difficulty urinating, dysuria and frequency.  Musculoskeletal: Negative for arthralgias, back pain and myalgias.  Skin: Negative for color change and rash.  Neurological: Negative for dizziness, syncope and headaches.  Hematological: Negative for adenopathy.  Psychiatric/Behavioral: Negative for dysphoric mood and sleep disturbance.    Patient Active Problem List   Diagnosis Date Noted  . Elevated low-density lipoprotein level 11/19/2014  . History of OCD (obsessive compulsive disorder) 11/19/2014    Allergies  Allergen Reactions  . Erythromycin Rash  . Penicillins Rash    Past Surgical History:   Procedure Laterality Date  . EYE MUSCLE SURGERY    . WISDOM TOOTH EXTRACTION      Social History   Tobacco Use  . Smoking status: Former Smoker    Types: Cigars  . Smokeless tobacco: Never Used  Substance Use Topics  . Alcohol use: Yes    Alcohol/week: 6.0 standard drinks    Types: 2 Cans of beer, 4 Standard drinks or equivalent per week  . Drug use: No     Medication list has been reviewed and updated.  Current Meds  Medication Sig  . sertraline (ZOLOFT) 100 MG tablet TAKE 1 TABLET BY MOUTH DAILY    PHQ 2/9 Scores 03/13/2018 02/22/2017 01/31/2017 06/04/2015  PHQ - 2 Score 0 0 0 0    Physical Exam Vitals signs and nursing note reviewed.  Constitutional:      Appearance: Normal appearance. He is well-developed.  HENT:     Head: Normocephalic.     Right Ear: Tympanic membrane, ear canal and external ear normal.     Left Ear: Tympanic membrane, ear canal and external ear normal.     Nose: Nose normal.     Mouth/Throat:     Pharynx: Uvula midline.  Eyes:     Conjunctiva/sclera: Conjunctivae normal.     Pupils: Pupils are equal, round, and reactive to light.  Neck:     Musculoskeletal: Normal range of motion and neck supple.     Thyroid: No thyromegaly.     Vascular: No carotid bruit.  Cardiovascular:     Rate and  Rhythm: Normal rate and regular rhythm.     Heart sounds: Normal heart sounds.  Pulmonary:     Effort: Pulmonary effort is normal.     Breath sounds: Normal breath sounds. No wheezing.  Chest:     Breasts:        Right: No mass.        Left: No mass.  Abdominal:     General: Bowel sounds are normal.     Palpations: Abdomen is soft.     Tenderness: There is no abdominal tenderness.  Musculoskeletal: Normal range of motion.  Lymphadenopathy:     Cervical: No cervical adenopathy.  Skin:    General: Skin is warm and dry.  Neurological:     Mental Status: He is alert and oriented to person, place, and time.     Deep Tendon Reflexes: Reflexes are  normal and symmetric.  Psychiatric:        Speech: Speech normal.        Behavior: Behavior normal.        Thought Content: Thought content normal.        Judgment: Judgment normal.    Wt Readings from Last 3 Encounters:  03/13/18 231 lb (104.8 kg)  02/22/17 231 lb (104.8 kg)  01/31/17 235 lb (106.6 kg)    BP 116/74   Pulse 62   Ht '5\' 11"'  (1.803 m)   Wt 231 lb (104.8 kg)   SpO2 97%   BMI 32.22 kg/m   Assessment and Plan: 1. Annual physical exam Normal exam except for weight Continue exercise and healthy diet - CBC with Differential/Platelet - Comprehensive metabolic panel - POCT urinalysis dipstick  2. Elevated low-density lipoprotein level - Lipid panel  3. History of OCD (obsessive compulsive disorder) Doing well on current therapy - sertraline (ZOLOFT) 100 MG tablet; Take 1 tablet (100 mg total) by mouth daily.  Dispense: 90 tablet; Refill: 3  4. Immunity to measles, mumps, and rubella determined by serologic test Check labs - Measles/Mumps/Rubella Immunity  5. Environmental and seasonal allergies Recommend Claritin or Allegra   Partially dictated using Editor, commissioning. Any errors are unintentional.  Halina Maidens, MD [facility]  03/13/2018

## 2018-03-13 NOTE — Patient Instructions (Signed)
Claritin or Allegra over the counter.   

## 2018-03-14 LAB — COMPREHENSIVE METABOLIC PANEL
A/G RATIO: 1.7 (ref 1.2–2.2)
ALBUMIN: 4.6 g/dL (ref 4.0–5.0)
ALT: 28 IU/L (ref 0–44)
AST: 29 IU/L (ref 0–40)
Alkaline Phosphatase: 100 IU/L (ref 39–117)
BUN/Creatinine Ratio: 15 (ref 9–20)
BUN: 15 mg/dL (ref 6–20)
Bilirubin Total: <0.2 mg/dL (ref 0.0–1.2)
CALCIUM: 9.8 mg/dL (ref 8.7–10.2)
CO2: 23 mmol/L (ref 20–29)
CREATININE: 1.02 mg/dL (ref 0.76–1.27)
Chloride: 104 mmol/L (ref 96–106)
GFR calc non Af Amer: 95 mL/min/{1.73_m2} (ref >59)
GFR, EST AFRICAN AMERICAN: 110 mL/min/{1.73_m2} (ref >59)
Globulin, Total: 2.7 g/dL (ref 1.5–4.5)
Glucose: 94 mg/dL (ref 65–99)
Potassium: 4.7 mmol/L (ref 3.5–5.2)
SODIUM: 142 mmol/L (ref 134–144)
TOTAL PROTEIN: 7.3 g/dL (ref 6.0–8.5)

## 2018-03-14 LAB — CBC WITH DIFFERENTIAL/PLATELET
Basophils Absolute: 0 10*3/uL (ref 0.0–0.2)
Basos: 0 %
EOS (ABSOLUTE): 0.2 10*3/uL (ref 0.0–0.4)
EOS: 2 %
HEMATOCRIT: 42.5 % (ref 37.5–51.0)
HEMOGLOBIN: 14.7 g/dL (ref 13.0–17.7)
IMMATURE GRANS (ABS): 0 10*3/uL (ref 0.0–0.1)
Immature Granulocytes: 0 %
LYMPHS: 31 %
Lymphocytes Absolute: 2.6 10*3/uL (ref 0.7–3.1)
MCH: 30.1 pg (ref 26.6–33.0)
MCHC: 34.6 g/dL (ref 31.5–35.7)
MCV: 87 fL (ref 79–97)
MONOCYTES: 9 %
MONOS ABS: 0.8 10*3/uL (ref 0.1–0.9)
NEUTROS PCT: 58 %
Neutrophils Absolute: 5 10*3/uL (ref 1.4–7.0)
Platelets: 258 10*3/uL (ref 150–450)
RBC: 4.88 x10E6/uL (ref 4.14–5.80)
RDW: 12.5 % (ref 11.6–15.4)
WBC: 8.6 10*3/uL (ref 3.4–10.8)

## 2018-03-14 LAB — LIPID PANEL
Chol/HDL Ratio: 4.1 ratio (ref 0.0–5.0)
Cholesterol, Total: 198 mg/dL (ref 100–199)
HDL: 48 mg/dL (ref >39)
LDL CALC: 126 mg/dL — AB (ref 0–99)
TRIGLYCERIDES: 121 mg/dL (ref 0–149)
VLDL Cholesterol Cal: 24 mg/dL (ref 5–40)

## 2018-03-14 LAB — MEASLES/MUMPS/RUBELLA IMMUNITY
MUMPS ABS, IGG: >300 AU/mL (ref >10.9)
RUBELLA: 19 {index} (ref >0.99)
RUBEOLA AB, IGG: 16.7 AU/mL (ref >16.4)

## 2018-07-17 ENCOUNTER — Other Ambulatory Visit: Payer: Self-pay | Admitting: Internal Medicine

## 2018-07-17 DIAGNOSIS — Z8659 Personal history of other mental and behavioral disorders: Secondary | ICD-10-CM

## 2018-10-19 ENCOUNTER — Ambulatory Visit (INDEPENDENT_AMBULATORY_CARE_PROVIDER_SITE_OTHER): Payer: 59

## 2018-10-19 ENCOUNTER — Other Ambulatory Visit: Payer: Self-pay

## 2018-10-19 DIAGNOSIS — Z23 Encounter for immunization: Secondary | ICD-10-CM

## 2018-11-24 DIAGNOSIS — Z20828 Contact with and (suspected) exposure to other viral communicable diseases: Secondary | ICD-10-CM | POA: Diagnosis not present

## 2018-12-04 ENCOUNTER — Other Ambulatory Visit: Payer: Self-pay

## 2018-12-04 DIAGNOSIS — Z20822 Contact with and (suspected) exposure to covid-19: Secondary | ICD-10-CM

## 2019-01-25 DIAGNOSIS — M25512 Pain in left shoulder: Secondary | ICD-10-CM | POA: Diagnosis not present

## 2019-01-25 DIAGNOSIS — S46912A Strain of unspecified muscle, fascia and tendon at shoulder and upper arm level, left arm, initial encounter: Secondary | ICD-10-CM | POA: Diagnosis not present

## 2019-01-25 DIAGNOSIS — M7552 Bursitis of left shoulder: Secondary | ICD-10-CM | POA: Diagnosis not present

## 2019-01-25 DIAGNOSIS — M25519 Pain in unspecified shoulder: Secondary | ICD-10-CM | POA: Diagnosis not present

## 2019-02-14 DIAGNOSIS — Z20822 Contact with and (suspected) exposure to covid-19: Secondary | ICD-10-CM | POA: Diagnosis not present

## 2019-02-15 DIAGNOSIS — M7552 Bursitis of left shoulder: Secondary | ICD-10-CM | POA: Diagnosis not present

## 2019-02-15 DIAGNOSIS — S46912A Strain of unspecified muscle, fascia and tendon at shoulder and upper arm level, left arm, initial encounter: Secondary | ICD-10-CM | POA: Diagnosis not present

## 2019-02-15 DIAGNOSIS — M25512 Pain in left shoulder: Secondary | ICD-10-CM | POA: Diagnosis not present

## 2019-04-05 ENCOUNTER — Ambulatory Visit: Payer: Self-pay | Attending: Internal Medicine

## 2019-04-05 DIAGNOSIS — Z23 Encounter for immunization: Secondary | ICD-10-CM

## 2019-04-05 NOTE — Progress Notes (Signed)
   Covid-19 Vaccination Clinic  Name:  Kevin Spears    MRN: [id number] DOB: 1985/04/11  04/05/2019  [name] was observed post Covid-19 immunization for 15 minutes without incident. He was provided with Vaccine Information Sheet and instruction to access the V-Safe system.   [name] was instructed to call 911 with any severe reactions post vaccine: Marland Kitchen Difficulty breathing  . Swelling of face and throat  . A fast heartbeat  . A bad rash all over body  . Dizziness and weakness   Immunizations Administered    Name Date Dose VIS Date Route   Pfizer COVID-19 Vaccine 04/05/2019 10:11 AM 0.3 mL 01/05/2019 Intramuscular   Manufacturer: [employer], [employer]   Lot: [id number]   NDC: [address]-7

## 2019-04-26 DIAGNOSIS — H5213 Myopia, bilateral: Secondary | ICD-10-CM | POA: Diagnosis not present

## 2019-05-01 ENCOUNTER — Ambulatory Visit: Payer: Self-pay

## 2019-05-04 ENCOUNTER — Other Ambulatory Visit: Payer: Self-pay

## 2019-05-04 ENCOUNTER — Encounter: Payer: Self-pay | Admitting: Internal Medicine

## 2019-05-04 ENCOUNTER — Ambulatory Visit (INDEPENDENT_AMBULATORY_CARE_PROVIDER_SITE_OTHER): Payer: 59 | Admitting: Internal Medicine

## 2019-05-04 VITALS — BP 118/78 | HR 72 | Temp 97.7°F | Ht 71.0 in | Wt 230.0 lb

## 2019-05-04 DIAGNOSIS — E7800 Pure hypercholesterolemia, unspecified: Secondary | ICD-10-CM

## 2019-05-04 DIAGNOSIS — J3089 Other allergic rhinitis: Secondary | ICD-10-CM

## 2019-05-04 DIAGNOSIS — Z8659 Personal history of other mental and behavioral disorders: Secondary | ICD-10-CM

## 2019-05-04 DIAGNOSIS — Z0000 Encounter for general adult medical examination without abnormal findings: Secondary | ICD-10-CM

## 2019-05-04 LAB — POCT URINALYSIS DIPSTICK
Bilirubin, UA: NEGATIVE
Blood, UA: NEGATIVE
Glucose, UA: NEGATIVE
Ketones, UA: NEGATIVE
Leukocytes, UA: NEGATIVE
Nitrite, UA: NEGATIVE
Protein, UA: NEGATIVE
Spec Grav, UA: <=1.005 — AB (ref 1.010–1.025)
Urobilinogen, UA: 0.2 E.U./dL
pH, UA: 6 (ref 5.0–8.0)

## 2019-05-04 MED ORDER — SERTRALINE HCL 100 MG PO TABS: 100.0000 mg | ORAL_TABLET | Freq: Every day | ORAL | 3 refills | Status: DC

## 2019-05-04 NOTE — Progress Notes (Signed)
Date:  05/04/2019   Name:  Kevin Spears   DOB:  [birth year]   MRN:  [id number]   Chief Complaint: Annual Exam Kevin Spears is a 34 y.o. male who presents today for his Complete Annual Exam. He feels well. He reports exercising regularly. He reports he is sleeping fairly well. He had one dose of Covid but went to the second dose and was cancelled due to runny nose.  Immunization History  Administered Date(s) Administered  . Influenza,inj,Quad PF,6+ Mos 11/17/2015, 10/07/2017, 10/19/2018  . Influenza-Unspecified 10/23/2014, 03/25/2016, 10/22/2016  . PFIZER SARS-COV-2 Vaccination 04/05/2019  . Tdap 06/25/2009    OCD - doing well on current treatment with Sertraline 100 mg.  No side effects.  He has been on this medication for a number of years. Allergy symptoms - he has seasonal sx of runny nose, PND and sneezing.   He takes Careers adviser as needed only.  No fever, SOB, wheezing, loss of taste or smell.    Lab Results  Component Value Date   CREATININE 1.02 03/13/2018   BUN 15 03/13/2018   NA 142 03/13/2018   K 4.7 03/13/2018   CL 104 03/13/2018   CO2 23 03/13/2018   Lab Results  Component Value Date   CHOL 198 03/13/2018   HDL 48 03/13/2018   LDLCALC 126 (H) 03/13/2018   TRIG 121 03/13/2018   CHOLHDL 4.1 03/13/2018   Lab Results  Component Value Date   TSH 1.940 06/04/2015   No results found for: HGBA1C Lab Results  Component Value Date   WBC 8.6 03/13/2018   HGB 14.7 03/13/2018   HCT 42.5 03/13/2018   MCV 87 03/13/2018   PLT 258 03/13/2018   Lab Results  Component Value Date   ALT 28 03/13/2018   AST 29 03/13/2018   ALKPHOS 100 03/13/2018   BILITOT <0.2 03/13/2018     Review of Systems  Constitutional: Negative for appetite change, chills, diaphoresis, fatigue and unexpected weight change.  HENT: Positive for postnasal drip and sneezing. Negative for hearing loss, sinus pressure, tinnitus, trouble swallowing and voice change.   Eyes: Negative for visual  disturbance.  Respiratory: Positive for cough. Negative for choking, shortness of breath and wheezing.   Cardiovascular: Negative for chest pain, palpitations and leg swelling.  Gastrointestinal: Negative for abdominal pain, blood in stool, constipation and diarrhea.  Genitourinary: Negative for difficulty urinating, dysuria and frequency.  Musculoskeletal: Positive for arthralgias (left ankle sprain). Negative for back pain and myalgias.  Skin: Negative for color change and rash.  Neurological: Negative for dizziness, syncope and headaches.  Hematological: Negative for adenopathy.  Psychiatric/Behavioral: Negative for dysphoric mood and sleep disturbance.    Patient Active Problem List   Diagnosis Date Noted  . Environmental and seasonal allergies 03/13/2018  . Elevated low-density lipoprotein level 11/19/2014  . History of OCD (obsessive compulsive disorder) 11/19/2014    Allergies  Allergen Reactions  . Erythromycin Rash  . Penicillins Rash    Past Surgical History:  Procedure Laterality Date  . EYE MUSCLE SURGERY    . WISDOM TOOTH EXTRACTION      Social History   Tobacco Use  . Smoking status: Former Smoker    Types: Cigars  . Smokeless tobacco: Never Used  Substance Use Topics  . Alcohol use: Yes    Alcohol/week: 6.0 standard drinks    Types: 2 Cans of beer, 4 Standard drinks or equivalent per week  . Drug use: No     Medication list  has been reviewed and updated.  Current Meds  Medication Sig  . sertraline (ZOLOFT) 100 MG tablet Take 1 tablet (100 mg total) by mouth daily.  . [DISCONTINUED] sertraline (ZOLOFT) 100 MG tablet TAKE 1 TABLET BY MOUTH DAILY    PHQ 2/9 Scores 05/04/2019 03/13/2018 02/22/2017 01/31/2017  PHQ - 2 Score 0 0 0 0  PHQ- 9 Score 0 - - -    BP Readings from Last 3 Encounters:  05/04/19 118/78  03/13/18 116/74  02/10/18 130/84    Physical Exam Vitals and nursing note reviewed.  Constitutional:      Appearance: Normal appearance. He  is well-developed.  HENT:     Head: Normocephalic.     Right Ear: Tympanic membrane, ear canal and external ear normal.     Left Ear: Tympanic membrane, ear canal and external ear normal.     Nose: Nose normal.     Right Sinus: No maxillary sinus tenderness.     Left Sinus: No maxillary sinus tenderness.  Eyes:     Conjunctiva/sclera: Conjunctivae normal.     Pupils: Pupils are equal, round, and reactive to light.  Neck:     Thyroid: No thyromegaly.     Vascular: No carotid bruit.  Cardiovascular:     Rate and Rhythm: Normal rate and regular rhythm.     Pulses: Normal pulses.     Heart sounds: Normal heart sounds.  Pulmonary:     Effort: Pulmonary effort is normal. No respiratory distress.     Breath sounds: Normal breath sounds. No wheezing.  Chest:     Breasts:        Right: No mass.        Left: No mass.  Abdominal:     General: Bowel sounds are normal.     Palpations: Abdomen is soft.     Tenderness: There is no abdominal tenderness.  Musculoskeletal:     Cervical back: Normal range of motion and neck supple.     Right lower leg: No edema.     Left lower leg: No edema.  Lymphadenopathy:     Cervical: No cervical adenopathy.  Skin:    General: Skin is warm and dry.     Capillary Refill: Capillary refill takes less than 2 seconds.     Findings: No rash.  Neurological:     General: No focal deficit present.     Mental Status: He is alert and oriented to person, place, and time.     Deep Tendon Reflexes: Reflexes are normal and symmetric.  Psychiatric:        Mood and Affect: Mood normal.        Speech: Speech normal.        Behavior: Behavior normal.        Thought Content: Thought content normal.     Wt Readings from Last 3 Encounters:  05/04/19 230 lb (104.3 kg)  03/13/18 231 lb (104.8 kg)  02/22/17 231 lb (104.8 kg)    BP 118/78   Pulse 72   Temp 97.7 F (36.5 C) (Temporal)   Ht 5\' 11"  (1.803 m)   Wt 230 lb (104.3 kg)   SpO2 96%   BMI 32.08 kg/m    Assessment and Plan: 1. Annual physical exam Normal exam except for weight - continue to work on diet and excercise - Comprehensive metabolic panel - CBC with Differential/Platelet - POCT urinalysis dipstick  2. Elevated low-density lipoprotein level Check labs and advise Continue healthy diet - Lipid  panel  3. History of OCD (obsessive compulsive disorder) Clinically stable on current regimen with good control of symptoms. Will continue current therapy. - sertraline (ZOLOFT) 100 MG tablet; Take 1 tablet (100 mg total) by mouth daily.  Dispense: 90 tablet; Refill: 3 - TSH  4. Environmental and seasonal allergies No evidence of bacterial or viral infection Continue Allegra as needed Note is given to proceed with second Covid vaccine   Partially dictated using Animal nutritionist. Any errors are unintentional.  Bari Edward, MD [facility]  05/04/2019

## 2019-05-05 LAB — CBC WITH DIFFERENTIAL/PLATELET
Basophils Absolute: 0 10*3/uL (ref 0.0–0.2)
Basos: 0 %
EOS (ABSOLUTE): 0.1 10*3/uL (ref 0.0–0.4)
Eos: 1 %
Hematocrit: 44.5 % (ref 37.5–51.0)
Hemoglobin: 14.6 g/dL (ref 13.0–17.7)
Immature Grans (Abs): 0 10*3/uL (ref 0.0–0.1)
Immature Granulocytes: 0 %
Lymphocytes Absolute: 1.9 10*3/uL (ref 0.7–3.1)
Lymphs: 28 %
MCH: 28.9 pg (ref 26.6–33.0)
MCHC: 32.8 g/dL (ref 31.5–35.7)
MCV: 88 fL (ref 79–97)
Monocytes Absolute: 0.7 10*3/uL (ref 0.1–0.9)
Monocytes: 9 %
Neutrophils Absolute: 4.2 10*3/uL (ref 1.4–7.0)
Neutrophils: 62 %
Platelets: 262 10*3/uL (ref 150–450)
RBC: 5.06 x10E6/uL (ref 4.14–5.80)
RDW: 12.1 % (ref 11.6–15.4)
WBC: 6.9 10*3/uL (ref 3.4–10.8)

## 2019-05-05 LAB — COMPREHENSIVE METABOLIC PANEL
ALT: 28 IU/L (ref 0–44)
AST: 26 IU/L (ref 0–40)
Albumin/Globulin Ratio: 1.7 (ref 1.2–2.2)
Albumin: 4.5 g/dL (ref 4.0–5.0)
Alkaline Phosphatase: 94 IU/L (ref 39–117)
BUN/Creatinine Ratio: 11 (ref 9–20)
BUN: 12 mg/dL (ref 6–20)
Bilirubin Total: 0.3 mg/dL (ref 0.0–1.2)
CO2: 24 mmol/L (ref 20–29)
Calcium: 9.9 mg/dL (ref 8.7–10.2)
Chloride: 101 mmol/L (ref 96–106)
Creatinine, Ser: 1.06 mg/dL (ref 0.76–1.27)
GFR calc Af Amer: 104 mL/min/{1.73_m2} (ref >59)
GFR calc non Af Amer: 90 mL/min/{1.73_m2} (ref >59)
Globulin, Total: 2.7 g/dL (ref 1.5–4.5)
Glucose: 88 mg/dL (ref 65–99)
Potassium: 4.6 mmol/L (ref 3.5–5.2)
Sodium: 138 mmol/L (ref 134–144)
Total Protein: 7.2 g/dL (ref 6.0–8.5)

## 2019-05-05 LAB — LIPID PANEL
Chol/HDL Ratio: 5 ratio (ref 0.0–5.0)
Cholesterol, Total: 233 mg/dL — ABNORMAL HIGH (ref 100–199)
HDL: 47 mg/dL (ref >39)
LDL Chol Calc (NIH): 167 mg/dL — ABNORMAL HIGH (ref 0–99)
Triglycerides: 105 mg/dL (ref 0–149)
VLDL Cholesterol Cal: 19 mg/dL (ref 5–40)

## 2019-05-05 LAB — TSH: TSH: 1.01 u[IU]/mL (ref 0.450–4.500)

## 2019-05-08 ENCOUNTER — Ambulatory Visit: Payer: Self-pay | Attending: Internal Medicine

## 2019-05-08 DIAGNOSIS — Z23 Encounter for immunization: Secondary | ICD-10-CM

## 2019-05-08 NOTE — Progress Notes (Signed)
   Covid-19 Vaccination Clinic  Name:  Kevin Spears    MRN: [id number] DOB: May 28, 1985  05/08/2019  Mr. [name] was observed post Covid-19 immunization for 15 minutes without incident. He was provided with Vaccine Information Sheet and instruction to access the V-Safe system.   Mr. [name] was instructed to call 911 with any severe reactions post vaccine: Marland Kitchen Difficulty breathing  . Swelling of face and throat  . A fast heartbeat  . A bad rash all over body  . Dizziness and weakness   Immunizations Administered    Name Date Dose VIS Date Route   Pfizer COVID-19 Vaccine 05/08/2019  8:46 AM 0.3 mL 01/05/2019 Intramuscular   Manufacturer: [employer], [employer]   Lot: [id number]   NDC: [address]-0

## 2019-08-28 ENCOUNTER — Other Ambulatory Visit: Payer: Self-pay | Admitting: Internal Medicine

## 2019-08-28 DIAGNOSIS — Z8659 Personal history of other mental and behavioral disorders: Secondary | ICD-10-CM

## 2019-09-03 DIAGNOSIS — Z20822 Contact with and (suspected) exposure to covid-19: Secondary | ICD-10-CM | POA: Diagnosis not present

## 2019-10-19 ENCOUNTER — Other Ambulatory Visit: Payer: Self-pay

## 2019-10-19 ENCOUNTER — Ambulatory Visit (INDEPENDENT_AMBULATORY_CARE_PROVIDER_SITE_OTHER): Payer: 59

## 2019-10-19 DIAGNOSIS — Z23 Encounter for immunization: Secondary | ICD-10-CM

## 2020-02-03 ENCOUNTER — Other Ambulatory Visit: Payer: Self-pay | Admitting: Nurse Practitioner

## 2020-02-03 DIAGNOSIS — R059 Cough, unspecified: Secondary | ICD-10-CM

## 2020-02-04 ENCOUNTER — Other Ambulatory Visit: Payer: 59

## 2020-02-04 DIAGNOSIS — R059 Cough, unspecified: Secondary | ICD-10-CM

## 2020-02-06 ENCOUNTER — Encounter: Payer: Self-pay | Admitting: Internal Medicine

## 2020-02-06 LAB — SARS-COV-2, NAA 2 DAY TAT

## 2020-02-06 LAB — NOVEL CORONAVIRUS, NAA: SARS-CoV-2, NAA: DETECTED — AB

## 2020-02-06 NOTE — Progress Notes (Signed)
Contacted via MyChart   I am sorry Kevin Spears, it did return positive.  You take care of yourself and if you need anything please reach out.  Definitely here to help you all:)

## 2020-05-07 ENCOUNTER — Encounter: Payer: 59 | Admitting: Internal Medicine

## 2020-07-04 ENCOUNTER — Other Ambulatory Visit: Payer: Self-pay

## 2020-07-04 ENCOUNTER — Ambulatory Visit: Payer: 59 | Admitting: Family Medicine

## 2020-07-04 ENCOUNTER — Ambulatory Visit
Admission: RE | Admit: 2020-07-04 | Discharge: 2020-07-04 | Disposition: A | Discharge status: Home / Self Care | Payer: 59 | Attending: Family Medicine | Admitting: Family Medicine

## 2020-07-04 ENCOUNTER — Encounter: Payer: 59 | Admitting: Family Medicine

## 2020-07-04 ENCOUNTER — Ambulatory Visit
Admission: RE | Admit: 2020-07-04 | Discharge: 2020-07-04 | Disposition: A | Discharge status: Home / Self Care | Payer: 59 | Source: Ambulatory Visit | Attending: Family Medicine | Admitting: Family Medicine

## 2020-07-04 ENCOUNTER — Encounter: Payer: Self-pay | Admitting: Family Medicine

## 2020-07-04 VITALS — BP 120/82 | HR 68 | Temp 98.2°F | Ht 71.0 in | Wt 218.8 lb

## 2020-07-04 DIAGNOSIS — S4992XA Unspecified injury of left shoulder and upper arm, initial encounter: Secondary | ICD-10-CM | POA: Diagnosis not present

## 2020-07-04 DIAGNOSIS — M24812 Other specific joint derangements of left shoulder, not elsewhere classified: Secondary | ICD-10-CM | POA: Diagnosis not present

## 2020-07-04 DIAGNOSIS — M7522 Bicipital tendinitis, left shoulder: Secondary | ICD-10-CM | POA: Insufficient documentation

## 2020-07-04 HISTORY — DX: Bicipital tendinitis, left shoulder: M75.22

## 2020-07-04 HISTORY — DX: Other specific joint derangements of left shoulder, not elsewhere classified: M24.812

## 2020-07-04 MED ORDER — MELOXICAM 15 MG PO TABS: 15.0000 mg | ORAL_TABLET | Freq: Every day | ORAL | 1 refills | Status: DC

## 2020-07-04 NOTE — Progress Notes (Signed)
Primary Care / Sports Medicine Office Visit  Patient Information:  Patient ID: Kevin Spears, male DOB: [birth year] Age: 35 y.o. MRN: [id number]   Kevin Spears is a pleasant 35 y.o. male presenting with the following:  Chief Complaint  Patient presents with   New Patient (Initial Visit)   Shoulder Injury    left; injured shoulder while weight lifting in 2020; referred to PT for the pain around that time via [facility]; patient exercises 6 days weekly doing weight-lifting and riding an exercise bike; occasionally takes Advil for the pain; right-handedness; 3/10 pain    Review of Systems pertinent details above   Patient Active Problem List   Diagnosis Date Noted   Biceps tendinitis, left 07/04/2020   Internal derangement of left shoulder 07/04/2020   Environmental and seasonal allergies 03/13/2018   Elevated low-density lipoprotein level 11/19/2014   History of OCD (obsessive compulsive disorder) 11/19/2014   Past Medical History:  Diagnosis Date   OCD (obsessive compulsive disorder)    Outpatient Encounter Medications as of 07/04/2020  Medication Sig   meloxicam (MOBIC) 15 MG tablet Take 1 tablet (15 mg total) by mouth daily.   sertraline (ZOLOFT) 100 MG tablet TAKE 1 TABLET BY MOUTH DAILY (Patient not taking: Reported on 07/04/2020)   No facility-administered encounter medications on file as of 07/04/2020.   Past Surgical History:  Procedure Laterality Date   EYE MUSCLE SURGERY     WISDOM TOOTH EXTRACTION      Vitals:   07/04/20 1454  BP: 120/82  Pulse: 68  Temp: 98.2 F (36.8 C)  SpO2: 98%   Vitals:   07/04/20 1454  Weight: 218 lb 12.8 oz (99.2 kg)  Height: 5\' 11"  (1.803 m)   Body mass index is 30.52 kg/m.  No results found.   Independent interpretation of notes and tests performed by another provider:   None  Procedures performed:   None  Pertinent History, Exam, Impression, and Recommendations:   Internal derangement of left  shoulder History of longstanding left shoulder pain, somewhat sharp and at times achy in nature, involvement into the left pectoral and left biceps region.  Onset in 2020 during upper body exercise inclusive shoulder press.  Pain severity is low however symptoms have persisted, most noted during lifting activities during his ADLs and upper body resistance in the gym.  Physical exam reveals no abnormalities to inspection, full painless range of motion, 5/5 rotator cuff strength testing that is painless, positive Neer's and Hawkins, focal tenderness at the bicipital groove and biceps tendon, positive O'Brien's, negative cross body.  Given the nature of his symptoms, overall clinical timeline, and examination findings today, high suspicion for glenoid labral pathology and most likely secondary biceps tendinopathy with compensatory involvement throughout the pectorals.  I have reviewed the same with the patient as well as treatment strategies moving forward.  Plan for left shoulder dedicated x-rays to rule out osseous pathology.  Given his overall reassuring clinical picture will initiate a dedicated course of twice weekly physical therapy, home exercises, scheduled meloxicam dosing for 2 weeks then as needed dosing, and follow-up at the end of physical therapy.  If suboptimal clinical response noted, anticipate MR arthrogram, can also consider intra-articular injection and/or biceps tendon sheath injection under ultrasound guidance-these are to be determined as clinically guided.  Biceps tendinitis, left Patient with focal bicipital groove/biceps tendon tenderness, this in the setting of concern for glenoid labral pathology on the left, patient will start meloxicam, physical therapy,  and follow-up in 6 weeks.    Orders & Medications Meds ordered this encounter  Medications   meloxicam (MOBIC) 15 MG tablet    Sig: Take 1 tablet (15 mg total) by mouth daily.    Dispense:  45 tablet    Refill:  1    Orders Placed This Encounter  Procedures   DG Shoulder Left   Ambulatory referral to Physical Therapy     Return in about 6 weeks (around 08/15/2020).     Jerrol Banana, MD   Primary Care Sports Medicine [facility]

## 2020-07-04 NOTE — Assessment & Plan Note (Signed)
Patient with focal bicipital groove/biceps tendon tenderness, this in the setting of concern for glenoid labral pathology on the left, patient will start meloxicam, physical therapy, and follow-up in 6 weeks.

## 2020-07-04 NOTE — Patient Instructions (Signed)
-   Start meloxicam and dose daily x2 weeks (take with food) - After 2 weeks can dose once daily on an as-needed basis with food - Follow-up with physical therapy, they will teach you home exercises to perform daily  [facility] Physical Therapy:  [place]:  [phone]

## 2020-07-04 NOTE — Assessment & Plan Note (Addendum)
History of longstanding left shoulder pain, somewhat sharp and at times achy in nature, involvement into the left pectoral and left biceps region.  Onset in 2020 during upper body exercise inclusive shoulder press.  Pain severity is low however symptoms have persisted, most noted during lifting activities during his ADLs and upper body resistance in the gym.  Physical exam reveals no abnormalities to inspection, full painless range of motion, 5/5 rotator cuff strength testing that is painless, positive Neer's and Hawkins, focal tenderness at the bicipital groove and biceps tendon, positive O'Brien's, negative cross body.  Given the nature of his symptoms, overall clinical timeline, and examination findings today, high suspicion for glenoid labral pathology and most likely secondary biceps tendinopathy with compensatory involvement throughout the pectorals.  I have reviewed the same with the patient as well as treatment strategies moving forward.  Plan for left shoulder dedicated x-rays to rule out osseous pathology.  Given his overall reassuring clinical picture will initiate a dedicated course of twice weekly physical therapy, home exercises, scheduled meloxicam dosing for 2 weeks then as needed dosing, and follow-up at the end of physical therapy.  If suboptimal clinical response noted, anticipate MR arthrogram, can also consider intra-articular injection and/or biceps tendon sheath injection under ultrasound guidance-these are to be determined as clinically guided.

## 2020-07-14 ENCOUNTER — Encounter: Payer: Self-pay | Admitting: Physical Therapy

## 2020-07-14 ENCOUNTER — Other Ambulatory Visit: Payer: Self-pay

## 2020-07-14 ENCOUNTER — Ambulatory Visit: Payer: 59 | Attending: Family Medicine | Admitting: Physical Therapy

## 2020-07-14 DIAGNOSIS — M67922 Unspecified disorder of synovium and tendon, left upper arm: Secondary | ICD-10-CM | POA: Diagnosis not present

## 2020-07-14 DIAGNOSIS — M7542 Impingement syndrome of left shoulder: Secondary | ICD-10-CM | POA: Diagnosis not present

## 2020-07-14 DIAGNOSIS — M25512 Pain in left shoulder: Secondary | ICD-10-CM | POA: Insufficient documentation

## 2020-07-14 DIAGNOSIS — G8929 Other chronic pain: Secondary | ICD-10-CM | POA: Insufficient documentation

## 2020-07-15 NOTE — Therapy (Signed)
[place] [facility] [address]. [place], [place], [address] Phone: [phone]   Fax:  [phone]  Physical Therapy Evaluation  Patient Details  Name: Kevin Spears MRN: [id number] Date of Birth: 1985/11/15 Referring Provider (PT): Dr. Joseph Berkshire  Encounter Date: 07/14/2020   PT End of Session - 07/15/20 0815     Visit Number 1    Number of Visits 4    Date for PT Re-Evaluation 08/12/20    Authorization - Visit Number 1    Authorization - Number of Visits 10    PT Start Time 1342    PT Stop Time 1441    PT Time Calculation (min) 59 min    Activity Tolerance Patient tolerated treatment well;No increased pain    Behavior During Therapy WFL for tasks assessed/performed             Past Medical History:  Diagnosis Date   OCD (obsessive compulsive disorder)     Past Surgical History:  Procedure Laterality Date   EYE MUSCLE SURGERY     WISDOM TOOTH EXTRACTION      There were no vitals filed for this visit.    Subjective Assessment - 07/14/20 1518     Subjective Pt presented to PT with L shoulder pain that was sudden onset 2/2 overhead press MOI ~3 years ago. Pt was able to rerack the weight and denies swelling or bruising after initial injury. Pt has no functional deficits at the time of eval, however, states that shoulder pain limits personal lifting for wellness due to fear of movement. Pt's current pain is 0/10 at the time of eval, 3/10 at worst after lifting.    Pertinent History Pt. stated that he had PT treatment at the time of injury, however, it was only an HEP. Pt no longer does the resisted band portion of the HEP due to breaking of the band. Current HEP includes biceps curls of 47# with a 1 sec hold at the bottom of the rep. Pt reports occasional and general numbness-like-feeling of the upper L arm. Pt reports no previous neck injury or pain. Pt reports that the injury is 90-95% healed and needs assists with the  final stage. Pt is currently not taking pain medication due to low pain.    Limitations Lifting    How long can you sit comfortably? WNL    How long can you stand comfortably? WNL    How long can you walk comfortably? WNL    Diagnostic tests x-ray    Patient Stated Goals Increase functionality of the L shoulder. Decrease fear of movement with exercise/lifting program.    Currently in Pain? No/denies    Pain Score 0-No pain    Pain Onset More than a month ago             See flowsheet   Objective measurements completed on examination: See above findings.      See HEP     PT Education - 07/15/20 6759     Education Details Pt ed for focus on HEP adherence, Importance of bicep tendon loading, and importance of correct static and dynamic posture.    Person(s) Educated Patient    Methods Explanation;Demonstration;Tactile cues;Verbal cues    Comprehension Verbalized understanding;Returned demonstration                 PT Long Term Goals - 07/15/20 0850       PT LONG TERM GOAL #1   Title  Pt will improve FOTO score to 88 for maximum functional capcity during ADLs and personal fitness    Baseline 80 (07/14/20)    Time 4    Period Weeks    Status New    Target Date 08/12/20      PT LONG TERM GOAL #2   Title Pt will reduce L shoulder pain to 2/10 during all functional and fittness activites to improve activites capacity    Baseline 3/10 (07/14/20)    Time 4    Period Weeks    Status New    Target Date 08/12/20      PT LONG TERM GOAL #3   Title Pt will display understanding of proper personal fitness plan that includes posterior chain muscles to reduce risk of muscle imbalance and impingment like sx    Baseline Current unable (07/14/20)    Time 4    Period Weeks    Status New    Target Date 08/12/20                Plan - 07/15/20 0824     Clinical Impression Statement Patient is a 35 year old presenting to clinic with chief complaints of L shoulder  pain 3/10 that is focal to anterior shoulder during lifting activites that was onset ~3 years go. Upon examination, patient demonstrates 5/5 MMT and full AROM bilat shoulder/ elbow. Pt presents with concordent pain during L elbow flexion. Special test resulted the following: Hawkins-Kenndy (+) bilat, biceps load 1+2 (-) bilat, open/empty can (+) bilat, yergasons (+) L, Speeds (+) L, Neer's (+) L.  Pt displays good prognosis 2/2 limited pain to 3/10 with frequent reduction to 0/10. Pt also presents with no funcitonal deficits and is primarily limited during lifiting activites for personal health and wellness. Pt was educated on correct static and dynamic posture to reduce impingemnet like sx and bicep tendon loading focus HEP to target focal pain in the area. Pt will benefit from continued PT 1xweek for 4 weeks to ensure proper adherence and posture during the HEP.    Personal Factors and Comorbidities Profession   prolonged seated bouts promoting forward shoulder posture.   Examination-Activity Limitations Lift    Examination-Participation Restrictions Other   personal wellness/fittness program   Stability/Clinical Decision Making Stable/Uncomplicated    Clinical Decision Making Low    Rehab Potential Good    PT Frequency 1x / week    PT Duration 4 weeks    PT Treatment/Interventions Biofeedback;Cryotherapy;Electrical Stimulation;Moist Heat;Functional mobility training;Gait training;Therapeutic activities;Therapeutic exercise;Balance training;Neuromuscular re-education;Manual techniques;Passive range of motion;Dry needling;Taping;Joint Manipulations;Patient/family education;ADLs/Self Care Home Management    PT Next Visit Plan Progress HEP    PT Home Exercise Plan [id number]    Consulted and Agree with Plan of Care Patient             Patient will benefit from skilled therapeutic intervention in order to improve the following deficits and impairments:  Decreased endurance, Impaired sensation,  Improper body mechanics, Impaired perceived functional ability, Decreased activity tolerance, Impaired UE functional use, Pain  Visit Diagnosis: Tendinopathy of left biceps tendon  Shoulder impingement syndrome, left  Chronic left shoulder pain     Problem List Patient Active Problem List   Diagnosis Date Noted   Biceps tendinitis, left 07/04/2020   Internal derangement of left shoulder 07/04/2020   Environmental and seasonal allergies 03/13/2018   Elevated low-density lipoprotein level 11/19/2014   History of OCD (obsessive compulsive disorder) 11/19/2014   Cammie Mcgee, PT, DPT # [phone]  Lawernce Ion, SPT 07/15/2020, 9:26 AM  [facility] [address] [place], [place], [address] Phone: [phone]   Fax:  [phone]  Name: Kevin Spears MRN: [id number] Date of Birth: [birth year]

## 2020-07-17 ENCOUNTER — Encounter: Payer: 59 | Admitting: Physical Therapy

## 2020-07-22 ENCOUNTER — Encounter: Payer: 59 | Admitting: Physical Therapy

## 2020-07-24 ENCOUNTER — Encounter: Payer: Self-pay | Admitting: Internal Medicine

## 2020-07-24 ENCOUNTER — Ambulatory Visit (INDEPENDENT_AMBULATORY_CARE_PROVIDER_SITE_OTHER): Payer: 59 | Admitting: Internal Medicine

## 2020-07-24 ENCOUNTER — Other Ambulatory Visit: Payer: Self-pay

## 2020-07-24 ENCOUNTER — Encounter: Payer: 59 | Admitting: Physical Therapy

## 2020-07-24 VITALS — BP 110/80 | HR 70 | Temp 98.1°F | Ht 71.0 in | Wt 212.0 lb

## 2020-07-24 DIAGNOSIS — Z23 Encounter for immunization: Secondary | ICD-10-CM

## 2020-07-24 DIAGNOSIS — Z0000 Encounter for general adult medical examination without abnormal findings: Secondary | ICD-10-CM | POA: Diagnosis not present

## 2020-07-24 DIAGNOSIS — Z8659 Personal history of other mental and behavioral disorders: Secondary | ICD-10-CM

## 2020-07-24 DIAGNOSIS — E7800 Pure hypercholesterolemia, unspecified: Secondary | ICD-10-CM | POA: Diagnosis not present

## 2020-07-24 NOTE — Progress Notes (Signed)
Date:  07/24/2020   Name:  Kevin Spears   DOB:  [birth year]   MRN:  [id number]   Chief Complaint: Annual Exam Kevin Spears is a 35 y.o. male who presents today for his Complete Annual Exam. He feels well. He reports exercising exercise bike a hour 2-3 days a week, Squats everyday, and weights. He reports he is sleeping well. He has lost 15-18 lbs since last year.  Colonoscopy: none  Immunization History  Administered Date(s) Administered   Influenza,inj,Quad PF,6+ Mos 11/17/2015, 10/07/2017, 10/19/2018, 10/19/2019   Influenza-Unspecified 10/23/2014, 03/25/2016, 10/22/2016   PFIZER(Purple Top)SARS-COV-2 Vaccination 04/05/2019, 05/08/2019, 12/11/2019   Tdap 06/25/2009, 07/24/2020    HPI  Lab Results  Component Value Date   CREATININE 1.06 05/04/2019   BUN 12 05/04/2019   NA 138 05/04/2019   K 4.6 05/04/2019   CL 101 05/04/2019   CO2 24 05/04/2019   Lab Results  Component Value Date   CHOL 233 (H) 05/04/2019   HDL 47 05/04/2019   LDLCALC 167 (H) 05/04/2019   TRIG 105 05/04/2019   CHOLHDL 5.0 05/04/2019   Lab Results  Component Value Date   TSH 1.010 05/04/2019   No results found for: HGBA1C Lab Results  Component Value Date   WBC 6.9 05/04/2019   HGB 14.6 05/04/2019   HCT 44.5 05/04/2019   MCV 88 05/04/2019   PLT 262 05/04/2019   Lab Results  Component Value Date   ALT 28 05/04/2019   AST 26 05/04/2019   ALKPHOS 94 05/04/2019   BILITOT 0.3 05/04/2019     Review of Systems  Constitutional:  Negative for appetite change, chills, diaphoresis, fatigue and unexpected weight change.  HENT:  Negative for hearing loss, tinnitus, trouble swallowing and voice change.   Eyes:  Negative for visual disturbance.  Respiratory:  Negative for choking, shortness of breath and wheezing.   Cardiovascular:  Negative for chest pain, palpitations and leg swelling.  Gastrointestinal:  Negative for abdominal pain, blood in stool, constipation and diarrhea.  Genitourinary:   Negative for difficulty urinating, dysuria and frequency.  Musculoskeletal:  Positive for arthralgias (left shoulder tendonitis - improving). Negative for back pain and myalgias.  Skin:  Negative for color change and rash.  Neurological:  Negative for dizziness, syncope and headaches.  Hematological:  Negative for adenopathy.  Psychiatric/Behavioral:  Negative for dysphoric mood and sleep disturbance. The patient is not nervous/anxious.    Patient Active Problem List   Diagnosis Date Noted   Biceps tendinitis, left 07/04/2020   Internal derangement of left shoulder 07/04/2020   Environmental and seasonal allergies 03/13/2018   Elevated low-density lipoprotein level 11/19/2014   History of OCD (obsessive compulsive disorder) 11/19/2014    Allergies  Allergen Reactions   Erythromycin Rash   Penicillins Rash    Past Surgical History:  Procedure Laterality Date   EYE MUSCLE SURGERY     WISDOM TOOTH EXTRACTION      Social History   Tobacco Use   Smoking status: Former    Pack years: 0.00    Types: Cigars   Smokeless tobacco: Never  Vaping Use   Vaping Use: Never used  Substance Use Topics   Alcohol use: Yes    Alcohol/week: 6.0 standard drinks    Types: 2 Cans of beer, 4 Standard drinks or equivalent per week   Drug use: Never     Medication list has been reviewed and updated.  Current Meds  Medication Sig   diphenhydrAMINE-APAP, sleep, (TYLENOL PM  EXTRA STRENGTH PO) Take by mouth as needed.   Ibuprofen (ADVIL PO) Take by mouth as needed.    PHQ 2/9 Scores 07/24/2020 05/04/2019 03/13/2018 02/22/2017  PHQ - 2 Score 0 0 0 0  PHQ- 9 Score 1 0 - -    GAD 7 : Generalized Anxiety Score 07/24/2020 05/04/2019  Nervous, Anxious, on Edge 0 1  Control/stop worrying 0 0  Worry too much - different things 0 0  Trouble relaxing 0 0  Restless 0 0  Easily annoyed or irritable 0 1  Afraid - awful might happen 0 0  Total GAD 7 Score 0 2  Anxiety Difficulty - Not difficult at all     BP Readings from Last 3 Encounters:  07/24/20 110/80  07/04/20 120/82  05/04/19 118/78    Physical Exam Vitals and nursing note reviewed.  Constitutional:      Appearance: Normal appearance. He is well-developed.  HENT:     Head: Normocephalic.     Right Ear: Tympanic membrane, ear canal and external ear normal.     Left Ear: Tympanic membrane, ear canal and external ear normal.     Nose: Nose normal.  Eyes:     Conjunctiva/sclera: Conjunctivae normal.     Pupils: Pupils are equal, round, and reactive to light.  Neck:     Thyroid: No thyromegaly.     Vascular: No carotid bruit.  Cardiovascular:     Rate and Rhythm: Normal rate and regular rhythm.     Heart sounds: Normal heart sounds.  Pulmonary:     Effort: Pulmonary effort is normal.     Breath sounds: Normal breath sounds. No wheezing.  Chest:     Chest wall: No mass or deformity.  Breasts:    Right: No mass or supraclavicular adenopathy.     Left: No mass or supraclavicular adenopathy.  Abdominal:     General: Bowel sounds are normal.     Palpations: Abdomen is soft.     Tenderness: There is no abdominal tenderness.  Musculoskeletal:        General: Normal range of motion.     Cervical back: Normal range of motion and neck supple.  Lymphadenopathy:     Cervical: No cervical adenopathy.     Upper Body:     Right upper body: No supraclavicular adenopathy.     Left upper body: No supraclavicular adenopathy.  Skin:    General: Skin is warm and dry.  Neurological:     Mental Status: He is alert and oriented to person, place, and time.     Deep Tendon Reflexes: Reflexes are normal and symmetric.  Psychiatric:        Attention and Perception: Attention normal.        Mood and Affect: Mood normal.        Thought Content: Thought content normal.    Wt Readings from Last 3 Encounters:  07/24/20 212 lb (96.2 kg)  07/04/20 218 lb 12.8 oz (99.2 kg)  05/04/19 230 lb (104.3 kg)    BP 110/80   Pulse 70   Temp  98.1 F (36.7 C) (Oral)   Ht 5\' 11"  (1.803 m)   Wt 212 lb (96.2 kg)   SpO2 97%   BMI 29.57 kg/m   Assessment and Plan: 1. Annual physical exam Normal exam Continue healthy diet, exercise and weight loss - CBC with Differential/Platelet - Comprehensive metabolic panel - TSH  2. History of OCD (obsessive compulsive disorder) He stopped sertraline 4 months  ago and is doing well off of medication If he decides that he needs to resume, he can call for a refill.  3. Elevated low-density lipoprotein level Will recheck and advise if medication is needed - Lipid panel  4. Need for diphtheria-tetanus-pertussis (Tdap) vaccine Given today - Tdap vaccine greater than or equal to 7yo IM   Partially dictated using Animal nutritionist. Any errors are unintentional.  Bari Edward, MD [facility]  07/24/2020

## 2020-07-25 LAB — COMPREHENSIVE METABOLIC PANEL
ALT: 29 IU/L (ref 0–44)
AST: 29 IU/L (ref 0–40)
Albumin/Globulin Ratio: 1.7 (ref 1.2–2.2)
Albumin: 4.6 g/dL (ref 4.0–5.0)
Alkaline Phosphatase: 71 IU/L (ref 44–121)
BUN/Creatinine Ratio: 9 (ref 9–20)
BUN: 10 mg/dL (ref 6–20)
Bilirubin Total: 0.3 mg/dL (ref 0.0–1.2)
CO2: 23 mmol/L (ref 20–29)
Calcium: 10 mg/dL (ref 8.7–10.2)
Chloride: 100 mmol/L (ref 96–106)
Creatinine, Ser: 1.15 mg/dL (ref 0.76–1.27)
Globulin, Total: 2.7 g/dL (ref 1.5–4.5)
Glucose: 94 mg/dL (ref 65–99)
Potassium: 5.1 mmol/L (ref 3.5–5.2)
Sodium: 145 mmol/L — ABNORMAL HIGH (ref 134–144)
Total Protein: 7.3 g/dL (ref 6.0–8.5)
eGFR: 84 mL/min/{1.73_m2} (ref >59)

## 2020-07-25 LAB — CBC WITH DIFFERENTIAL/PLATELET
Basophils Absolute: 0 10*3/uL (ref 0.0–0.2)
Basos: 1 %
EOS (ABSOLUTE): 0.1 10*3/uL (ref 0.0–0.4)
Eos: 2 %
Hematocrit: 44.8 % (ref 37.5–51.0)
Hemoglobin: 14.9 g/dL (ref 13.0–17.7)
Immature Grans (Abs): 0 10*3/uL (ref 0.0–0.1)
Immature Granulocytes: 0 %
Lymphocytes Absolute: 1.9 10*3/uL (ref 0.7–3.1)
Lymphs: 31 %
MCH: 29.6 pg (ref 26.6–33.0)
MCHC: 33.3 g/dL (ref 31.5–35.7)
MCV: 89 fL (ref 79–97)
Monocytes Absolute: 0.6 10*3/uL (ref 0.1–0.9)
Monocytes: 9 %
Neutrophils Absolute: 3.5 10*3/uL (ref 1.4–7.0)
Neutrophils: 57 %
Platelets: 280 10*3/uL (ref 150–450)
RBC: 5.04 x10E6/uL (ref 4.14–5.80)
RDW: 11.9 % (ref 11.6–15.4)
WBC: 6.1 10*3/uL (ref 3.4–10.8)

## 2020-07-25 LAB — TSH: TSH: 1.12 u[IU]/mL (ref 0.450–4.500)

## 2020-07-25 LAB — LIPID PANEL
Chol/HDL Ratio: 4.2 ratio (ref 0.0–5.0)
Cholesterol, Total: 214 mg/dL — ABNORMAL HIGH (ref 100–199)
HDL: 51 mg/dL (ref >39)
LDL Chol Calc (NIH): 145 mg/dL — ABNORMAL HIGH (ref 0–99)
Triglycerides: 101 mg/dL (ref 0–149)
VLDL Cholesterol Cal: 18 mg/dL (ref 5–40)

## 2020-07-29 ENCOUNTER — Other Ambulatory Visit: Payer: Self-pay

## 2020-07-29 ENCOUNTER — Ambulatory Visit: Payer: 59 | Attending: Family Medicine

## 2020-07-29 DIAGNOSIS — M25512 Pain in left shoulder: Secondary | ICD-10-CM | POA: Diagnosis not present

## 2020-07-29 DIAGNOSIS — M7542 Impingement syndrome of left shoulder: Secondary | ICD-10-CM | POA: Insufficient documentation

## 2020-07-29 DIAGNOSIS — M67922 Unspecified disorder of synovium and tendon, left upper arm: Secondary | ICD-10-CM | POA: Diagnosis not present

## 2020-07-29 DIAGNOSIS — G8929 Other chronic pain: Secondary | ICD-10-CM | POA: Diagnosis not present

## 2020-07-30 ENCOUNTER — Telehealth: Payer: Self-pay | Admitting: Family Medicine

## 2020-07-30 ENCOUNTER — Other Ambulatory Visit: Payer: Self-pay | Admitting: Family Medicine

## 2020-07-30 DIAGNOSIS — R202 Paresthesia of skin: Secondary | ICD-10-CM

## 2020-07-30 NOTE — Telephone Encounter (Signed)
    Primary Care / Sports Medicine Telephone Note  Patient Information:  Patient ID: Kevin Spears, male DOB: 1985/02/24 Age: 36 y.o. MRN: [id number]    PT note reviewed, patient appears to have improved subjectively and objectively. Reported upper extremity paresthesias, cervical spine x-ray ordered, advised patient of the same via MyChart and need for x-ray and follow-up if symptoms persist.  Jerrol Banana, MD   Primary Care Sports Medicine [facility] [place]

## 2020-07-30 NOTE — Therapy (Signed)
[place] [address] [place], [place], [address] Phone: [phone]   Fax:  [phone]  Physical Therapy Treatment and Discharge Plan of care: 07/14/2020-07/29/2020  Patient Details  Name: Kevin Spears MRN: [id number] Date of Birth: Mar 28, 1985 Referring Provider (PT): Dr. Rosette Reveal   Encounter Date: 07/29/2020   PT End of Session - 07/30/20 0724     Visit Number 2    Number of Visits 4    Date for PT Re-Evaluation 08/12/20    Authorization - Visit Number 2    Authorization - Number of Visits 10    PT Start Time 3559    PT Stop Time 7416    PT Time Calculation (min) 46 min    Activity Tolerance Patient tolerated treatment well;No increased pain    Behavior During Therapy WFL for tasks assessed/performed             Past Medical History:  Diagnosis Date   OCD (obsessive compulsive disorder)     Past Surgical History:  Procedure Laterality Date   EYE MUSCLE SURGERY     WISDOM TOOTH EXTRACTION      There were no vitals filed for this visit.   Subjective Assessment - 07/30/20 0715     Subjective Pt states that his L shoulder has improved with mild weakness compared to R, however, he is no longer experiencing pain. Pt does report mild muscle soreness L>R, however, contributes it to increase work load during personal fitness and daily lifting task. Pt reports good adherence to the HEP and stated that it has help a lot. Pt is agreeable to discharge after today visit with an updated HEP to facilitate proper lifting mechanics.    Pertinent History Pt. stated that he had PT treatment at the time of injury, however, it was only an HEP. Pt no longer does the resisted band portion of the HEP due to breaking of the band. Current HEP includes biceps curls of 47# with a 1 sec hold at the bottom of the rep. Pt reports occasional and general numbness-like-feeling of the upper L arm. Pt reports no previous neck injury or pain. Pt  reports that the injury is 90-95% healed and needs assists with the final stage. Pt is currently not taking pain medication due to low pain.    How long can you sit comfortably? WNL    How long can you stand comfortably? WNL    How long can you walk comfortably? WNL    Diagnostic tests x-ray    Patient Stated Goals Increase functionality of the L shoulder. Decrease fear of movement with exercise/lifting program.    Currently in Pain? No/denies    Pain Score 0-No pain    Pain Onset More than a month ago              Therapeutic Exercise:   See updated HEP- Pt performed reps and sets per connected HEP during session.   Pt was educated on proper lifting mechanics during high level exercises for specific muscle loading to increase correct bicep loading, pt posture, and improved scapular stabilization. Verbal cueing and PT demonstration resulted good-excellent pt demonstration.    Pt educated on proper squat mechanics, per pt request, for safe mobility for the back and shoulders. Pt displayed good squat mechanics after verbal cueing and PT demonstration.    PT Education - 07/30/20 3845     Education Details Continue bicep tendon loading HEP and begin more advanced HEP  targetting post thoracic chain muscle for greater scapular stability.    Person(s) Educated Patient    Methods Explanation;Demonstration;Verbal cues;Handout    Comprehension Verbalized understanding;Returned demonstration                 PT Long Term Goals - 07/29/20 1802       PT LONG TERM GOAL #1   Title Pt will improve FOTO score to 88 for maximum functional capcity during ADLs and personal fitness    Baseline 80 (07/14/20) 7/5: 92    Time 4    Period Weeks    Status Achieved      PT LONG TERM GOAL #2   Title Pt will reduce L shoulder pain to 2/10 during all functional and fittness activites to improve activites capacity    Baseline 3/10 (07/14/20) 7/5: 0/10    Time 4    Period Weeks    Status  Achieved      PT LONG TERM GOAL #3   Title Pt will display understanding of proper personal fitness plan that includes posterior chain muscles to reduce risk of muscle imbalance and impingment like sx    Baseline Current unable (07/14/20). Pt displayed good mechanics and understanding of exercises in clincic to use at home/gym    Time 4    Period Weeks    Status Achieved                   Plan - 07/29/20 1819     Clinical Impression Statement Pt was has displayed excellent improvement from initial eval. Pt has met all of his goals with a FOTO score of 92 from 80, pain 0/10, and good in clinic demonstration of independence with advanced lifting HEP to target post thoracic chain loading for increased scapular stabilization. Pt has been discharge with good prognosis for full return to high level lifting for personal fitness, and pain free use of the shoulder to ADLs.    Personal Factors and Comorbidities Profession   prolonged seated bouts promoting forward shoulder posture.   Examination-Activity Limitations Lift    Examination-Participation Restrictions Other   personal wellness/fittness program   Stability/Clinical Decision Making Stable/Uncomplicated    Clinical Decision Making Moderate    Rehab Potential Good    PT Frequency 1x / week    PT Duration 4 weeks    PT Treatment/Interventions Biofeedback;Cryotherapy;Electrical Stimulation;Moist Heat;Functional mobility training;Gait training;Therapeutic activities;Therapeutic exercise;Balance training;Neuromuscular re-education;Manual techniques;Passive range of motion;Dry needling;Taping;Joint Manipulations;Patient/family education;ADLs/Self Care Home Management    PT Next Visit Plan Progress HEP    PT Home Exercise Plan [id number](tendon loading) [id number] (advanved lifting program)    Consulted and Agree with Plan of Care Patient             Patient will benefit from skilled therapeutic intervention in order to improve the  following deficits and impairments:  Decreased endurance, Impaired sensation, Improper body mechanics, Impaired perceived functional ability, Decreased activity tolerance, Impaired UE functional use, Pain  Visit Diagnosis: Tendinopathy of left biceps tendon  Shoulder impingement syndrome, left  Chronic left shoulder pain     Problem List Patient Active Problem List   Diagnosis Date Noted   Biceps tendinitis, left 07/04/2020   Internal derangement of left shoulder 07/04/2020   Environmental and seasonal allergies 03/13/2018   Elevated low-density lipoprotein level 11/19/2014   History of OCD (obsessive compulsive disorder) 11/19/2014    Fara Olden SPT  Salem Caster. Fairly IV, PT, DPT Physical Therapist- [facility]  07/30/2020, 8:27 AM  [facility] [address] [place], [place], [address] Phone: [phone]   Fax:  [phone]  Name: Kevin Spears MRN: [id number] Date of Birth: September 29, 1985

## 2020-07-31 ENCOUNTER — Encounter: Payer: 59 | Admitting: Physical Therapy

## 2020-08-05 ENCOUNTER — Encounter: Payer: 59 | Admitting: Physical Therapy

## 2020-08-07 ENCOUNTER — Encounter: Payer: 59 | Admitting: Physical Therapy

## 2020-08-12 ENCOUNTER — Encounter: Payer: 59 | Admitting: Physical Therapy

## 2020-08-14 ENCOUNTER — Encounter: Payer: 59 | Admitting: Physical Therapy

## 2020-08-19 ENCOUNTER — Encounter: Payer: 59 | Admitting: Physical Therapy

## 2020-08-21 ENCOUNTER — Encounter: Payer: 59 | Admitting: Physical Therapy

## 2021-03-31 DIAGNOSIS — H5212 Myopia, left eye: Secondary | ICD-10-CM | POA: Diagnosis not present

## 2021-03-31 DIAGNOSIS — H5201 Hypermetropia, right eye: Secondary | ICD-10-CM | POA: Diagnosis not present

## 2021-03-31 DIAGNOSIS — H52223 Regular astigmatism, bilateral: Secondary | ICD-10-CM | POA: Diagnosis not present

## 2021-07-29 ENCOUNTER — Ambulatory Visit (INDEPENDENT_AMBULATORY_CARE_PROVIDER_SITE_OTHER): Payer: 59 | Admitting: Internal Medicine

## 2021-07-29 ENCOUNTER — Encounter: Payer: Self-pay | Admitting: Internal Medicine

## 2021-07-29 VITALS — BP 100/68 | HR 64 | Ht 71.0 in | Wt 185.0 lb

## 2021-07-29 DIAGNOSIS — Z0000 Encounter for general adult medical examination without abnormal findings: Secondary | ICD-10-CM | POA: Diagnosis not present

## 2021-07-29 DIAGNOSIS — E7800 Pure hypercholesterolemia, unspecified: Secondary | ICD-10-CM | POA: Diagnosis not present

## 2021-07-29 DIAGNOSIS — Z1159 Encounter for screening for other viral diseases: Secondary | ICD-10-CM | POA: Diagnosis not present

## 2021-07-29 DIAGNOSIS — J3089 Other allergic rhinitis: Secondary | ICD-10-CM | POA: Diagnosis not present

## 2021-07-29 NOTE — Progress Notes (Signed)
Date:  07/29/2021   Name:  Kevin Spears   DOB:  December 02, 1985   MRN:  [id number]   Chief Complaint: Annual Exam Kevin Spears is a 36 y.o. male who presents today for his Complete Annual Exam. He feels well. He reports exercising- weights and bicycle riding. He reports he is sleeping fairly well. He has lost 35 lbs and is keeping it off.  Colonoscopy: none  Immunization History  Administered Date(s) Administered   Influenza,inj,Quad PF,6+ Mos 11/17/2015, 10/07/2017, 10/19/2018, 10/19/2019   Influenza-Unspecified 10/23/2014, 03/25/2016, 10/22/2016   PFIZER(Purple Top)SARS-COV-2 Vaccination 04/05/2019, 05/08/2019, 12/11/2019   Tdap 06/25/2009, 07/24/2020   Health Maintenance Due  Topic Date Due   HIV Screening  Never done   Hepatitis C Screening  Never done    No results found for: "PSA1", "PSA"  HPI  Lab Results  Component Value Date   NA 145 (H) 07/24/2020   K 5.1 07/24/2020   CO2 23 07/24/2020   GLUCOSE 94 07/24/2020   BUN 10 07/24/2020   CREATININE 1.15 07/24/2020   CALCIUM 10.0 07/24/2020   EGFR 84 07/24/2020   GFRNONAA 90 05/04/2019   Lab Results  Component Value Date   CHOL 214 (H) 07/24/2020   HDL 51 07/24/2020   LDLCALC 145 (H) 07/24/2020   TRIG 101 07/24/2020   CHOLHDL 4.2 07/24/2020   Lab Results  Component Value Date   TSH 1.120 07/24/2020   No results found for: "HGBA1C" Lab Results  Component Value Date   WBC 6.1 07/24/2020   HGB 14.9 07/24/2020   HCT 44.8 07/24/2020   MCV 89 07/24/2020   PLT 280 07/24/2020   Lab Results  Component Value Date   ALT 29 07/24/2020   AST 29 07/24/2020   ALKPHOS 71 07/24/2020   BILITOT 0.3 07/24/2020   No results found for: "25OHVITD2", "25OHVITD3", "VD25OH"   Review of Systems  Constitutional:  Negative for appetite change, chills, diaphoresis, fatigue and unexpected weight change.  HENT:  Negative for hearing loss, tinnitus, trouble swallowing and voice change.   Eyes:  Negative for visual  disturbance.  Respiratory:  Negative for choking, shortness of breath and wheezing.   Cardiovascular:  Negative for chest pain, palpitations and leg swelling.  Gastrointestinal:  Negative for abdominal pain, blood in stool, constipation and diarrhea.  Genitourinary:  Negative for difficulty urinating, dysuria, frequency and urgency.  Musculoskeletal:  Negative for arthralgias, back pain and myalgias.  Skin:  Negative for color change and rash.  Neurological:  Negative for dizziness, syncope and headaches.  Hematological:  Negative for adenopathy.  Psychiatric/Behavioral:  Negative for dysphoric mood and sleep disturbance. The patient is not nervous/anxious.     Patient Active Problem List   Diagnosis Date Noted   Biceps tendinitis, left 07/04/2020   Internal derangement of left shoulder 07/04/2020   Environmental and seasonal allergies 03/13/2018   Elevated low-density lipoprotein level 11/19/2014   History of OCD (obsessive compulsive disorder) 11/19/2014    Allergies  Allergen Reactions   Erythromycin Rash   Penicillins Rash    Past Surgical History:  Procedure Laterality Date   EYE MUSCLE SURGERY     WISDOM TOOTH EXTRACTION      Social History   Tobacco Use   Smoking status: Former    Types: Cigars   Smokeless tobacco: Never  Vaping Use   Vaping Use: Never used  Substance Use Topics   Alcohol use: Yes    Alcohol/week: 6.0 standard drinks of alcohol  Types: 2 Cans of beer, 4 Standard drinks or equivalent per week   Drug use: Never     Medication list has been reviewed and updated.  Current Meds  Medication Sig   diphenhydrAMINE-APAP, sleep, (TYLENOL PM EXTRA STRENGTH PO) Take by mouth as needed.   Ibuprofen (ADVIL PO) Take by mouth as needed.       07/29/2021    9:15 AM 07/24/2020    9:52 AM 05/04/2019   10:26 AM  GAD 7 : Generalized Anxiety Score  Nervous, Anxious, on Edge 1 0 1  Control/stop worrying 0 0 0  Worry too much - different things 0 0 0   Trouble relaxing 0 0 0  Restless 0 0 0  Easily annoyed or irritable 0 0 1  Afraid - awful might happen 0 0 0  Total GAD 7 Score 1 0 2  Anxiety Difficulty Not difficult at all  Not difficult at all       07/29/2021    9:14 AM 07/24/2020    9:52 AM 05/04/2019   10:25 AM  Depression screen PHQ 2/9  Decreased Interest 0 0 0  Down, Depressed, Hopeless 0 0 0  PHQ - 2 Score 0 0 0  Altered sleeping 1 1 0  Tired, decreased energy 0 0 0  Change in appetite 0 0 0  Feeling bad or failure about yourself  0 0 0  Trouble concentrating 0 0 0  Moving slowly or fidgety/restless 0 0 0  Suicidal thoughts 0 0 0  PHQ-9 Score 1 1 0  Difficult doing work/chores Not difficult at all Not difficult at all Not difficult at all    BP Readings from Last 3 Encounters:  07/29/21 100/68  07/24/20 110/80  07/04/20 120/82    Physical Exam Vitals and nursing note reviewed.  Constitutional:      Appearance: Normal appearance. He is well-developed.  HENT:     Head: Normocephalic.     Right Ear: Tympanic membrane, ear canal and external ear normal.     Left Ear: Tympanic membrane, ear canal and external ear normal.     Nose: Nose normal.  Eyes:     Conjunctiva/sclera: Conjunctivae normal.     Pupils: Pupils are equal, round, and reactive to light.  Neck:     Thyroid: No thyromegaly.     Vascular: No carotid bruit.  Cardiovascular:     Rate and Rhythm: Normal rate and regular rhythm.     Heart sounds: Normal heart sounds.  Pulmonary:     Effort: Pulmonary effort is normal.     Breath sounds: Normal breath sounds. No wheezing.  Chest:  Breasts:    Right: No mass.     Left: No mass.  Abdominal:     General: Bowel sounds are normal.     Palpations: Abdomen is soft.     Tenderness: There is no abdominal tenderness.  Musculoskeletal:        General: Normal range of motion.     Cervical back: Normal range of motion and neck supple.  Lymphadenopathy:     Cervical: No cervical adenopathy.  Skin:     General: Skin is warm and dry.  Neurological:     Mental Status: He is alert and oriented to person, place, and time.     Deep Tendon Reflexes: Reflexes are normal and symmetric.  Psychiatric:        Attention and Perception: Attention normal.        Mood and Affect:  Mood normal.        Thought Content: Thought content normal.     Wt Readings from Last 3 Encounters:  07/29/21 185 lb (83.9 kg)  07/24/20 212 lb (96.2 kg)  07/04/20 218 lb 12.8 oz (99.2 kg)    BP 100/68   Pulse 64   Ht '5\' 11"'  (1.803 m)   Wt 185 lb (83.9 kg)   SpO2 100%   BMI 25.80 kg/m   Assessment and Plan: 1. Annual physical exam Normal exam Continue healthy diet and exercise Eligible for Bivalent Covid vaccine - CBC with Differential/Platelet - Comprehensive metabolic panel - TSH  2. Elevated low-density lipoprotein level Continue with diet changes Will advise if medication is needed - Lipid panel  3. Environmental and seasonal allergies unchanged  4. Need for hepatitis C screening test - Hepatitis C antibody   Partially dictated using Editor, commissioning. Any errors are unintentional.  Halina Maidens, MD [facility]  07/29/2021

## 2021-07-30 LAB — CBC WITH DIFFERENTIAL/PLATELET
Basophils Absolute: 0 10*3/uL (ref 0.0–0.2)
Basos: 0 %
EOS (ABSOLUTE): 0.2 10*3/uL (ref 0.0–0.4)
Eos: 2 %
Hematocrit: 42.1 % (ref 37.5–51.0)
Hemoglobin: 14.2 g/dL (ref 13.0–17.7)
Immature Grans (Abs): 0 10*3/uL (ref 0.0–0.1)
Immature Granulocytes: 0 %
Lymphocytes Absolute: 2.7 10*3/uL (ref 0.7–3.1)
Lymphs: 39 %
MCH: 29.4 pg (ref 26.6–33.0)
MCHC: 33.7 g/dL (ref 31.5–35.7)
MCV: 87 fL (ref 79–97)
Monocytes Absolute: 0.7 10*3/uL (ref 0.1–0.9)
Monocytes: 10 %
Neutrophils Absolute: 3.3 10*3/uL (ref 1.4–7.0)
Neutrophils: 49 %
Platelets: 249 10*3/uL (ref 150–450)
RBC: 4.83 x10E6/uL (ref 4.14–5.80)
RDW: 12.2 % (ref 11.6–15.4)
WBC: 6.9 10*3/uL (ref 3.4–10.8)

## 2021-07-30 LAB — COMPREHENSIVE METABOLIC PANEL
ALT: 21 IU/L (ref 0–44)
AST: 23 IU/L (ref 0–40)
Albumin/Globulin Ratio: 2 (ref 1.2–2.2)
Albumin: 4.5 g/dL (ref 4.0–5.0)
Alkaline Phosphatase: 65 IU/L (ref 44–121)
BUN/Creatinine Ratio: 14 (ref 9–20)
BUN: 14 mg/dL (ref 6–20)
Bilirubin Total: 0.3 mg/dL (ref 0.0–1.2)
CO2: 23 mmol/L (ref 20–29)
Calcium: 9.5 mg/dL (ref 8.7–10.2)
Chloride: 104 mmol/L (ref 96–106)
Creatinine, Ser: 1 mg/dL (ref 0.76–1.27)
Globulin, Total: 2.3 g/dL (ref 1.5–4.5)
Glucose: 90 mg/dL (ref 70–99)
Potassium: 4 mmol/L (ref 3.5–5.2)
Sodium: 141 mmol/L (ref 134–144)
Total Protein: 6.8 g/dL (ref 6.0–8.5)
eGFR: 99 mL/min/{1.73_m2} (ref >59)

## 2021-07-30 LAB — TSH: TSH: 2.66 u[IU]/mL (ref 0.450–4.500)

## 2021-07-30 LAB — LIPID PANEL
Chol/HDL Ratio: 2.9 ratio (ref 0.0–5.0)
Cholesterol, Total: 182 mg/dL (ref 100–199)
HDL: 63 mg/dL (ref >39)
LDL Chol Calc (NIH): 106 mg/dL — ABNORMAL HIGH (ref 0–99)
Triglycerides: 67 mg/dL (ref 0–149)
VLDL Cholesterol Cal: 13 mg/dL (ref 5–40)

## 2021-07-30 LAB — HEPATITIS C ANTIBODY: Hep C Virus Ab: NONREACTIVE

## 2022-01-04 ENCOUNTER — Encounter: Payer: Self-pay | Admitting: Internal Medicine

## 2022-01-04 ENCOUNTER — Ambulatory Visit: Payer: 59 | Admitting: Internal Medicine

## 2022-01-04 VITALS — BP 126/78 | HR 64 | Ht 71.0 in | Wt 179.4 lb

## 2022-01-04 DIAGNOSIS — M62830 Muscle spasm of back: Secondary | ICD-10-CM | POA: Diagnosis not present

## 2022-01-04 DIAGNOSIS — M5431 Sciatica, right side: Secondary | ICD-10-CM

## 2022-01-04 IMAGING — CR DG SHOULDER 2+V*L*
3 series · 3 of 3 positions shown · non-contrast
Comparison: None.

CLINICAL DATA: Left shoulder injury two years prior, persistent
pain with exertion.

EXAM:
LEFT SHOULDER - 2+ VIEW

[shoulder grashey]
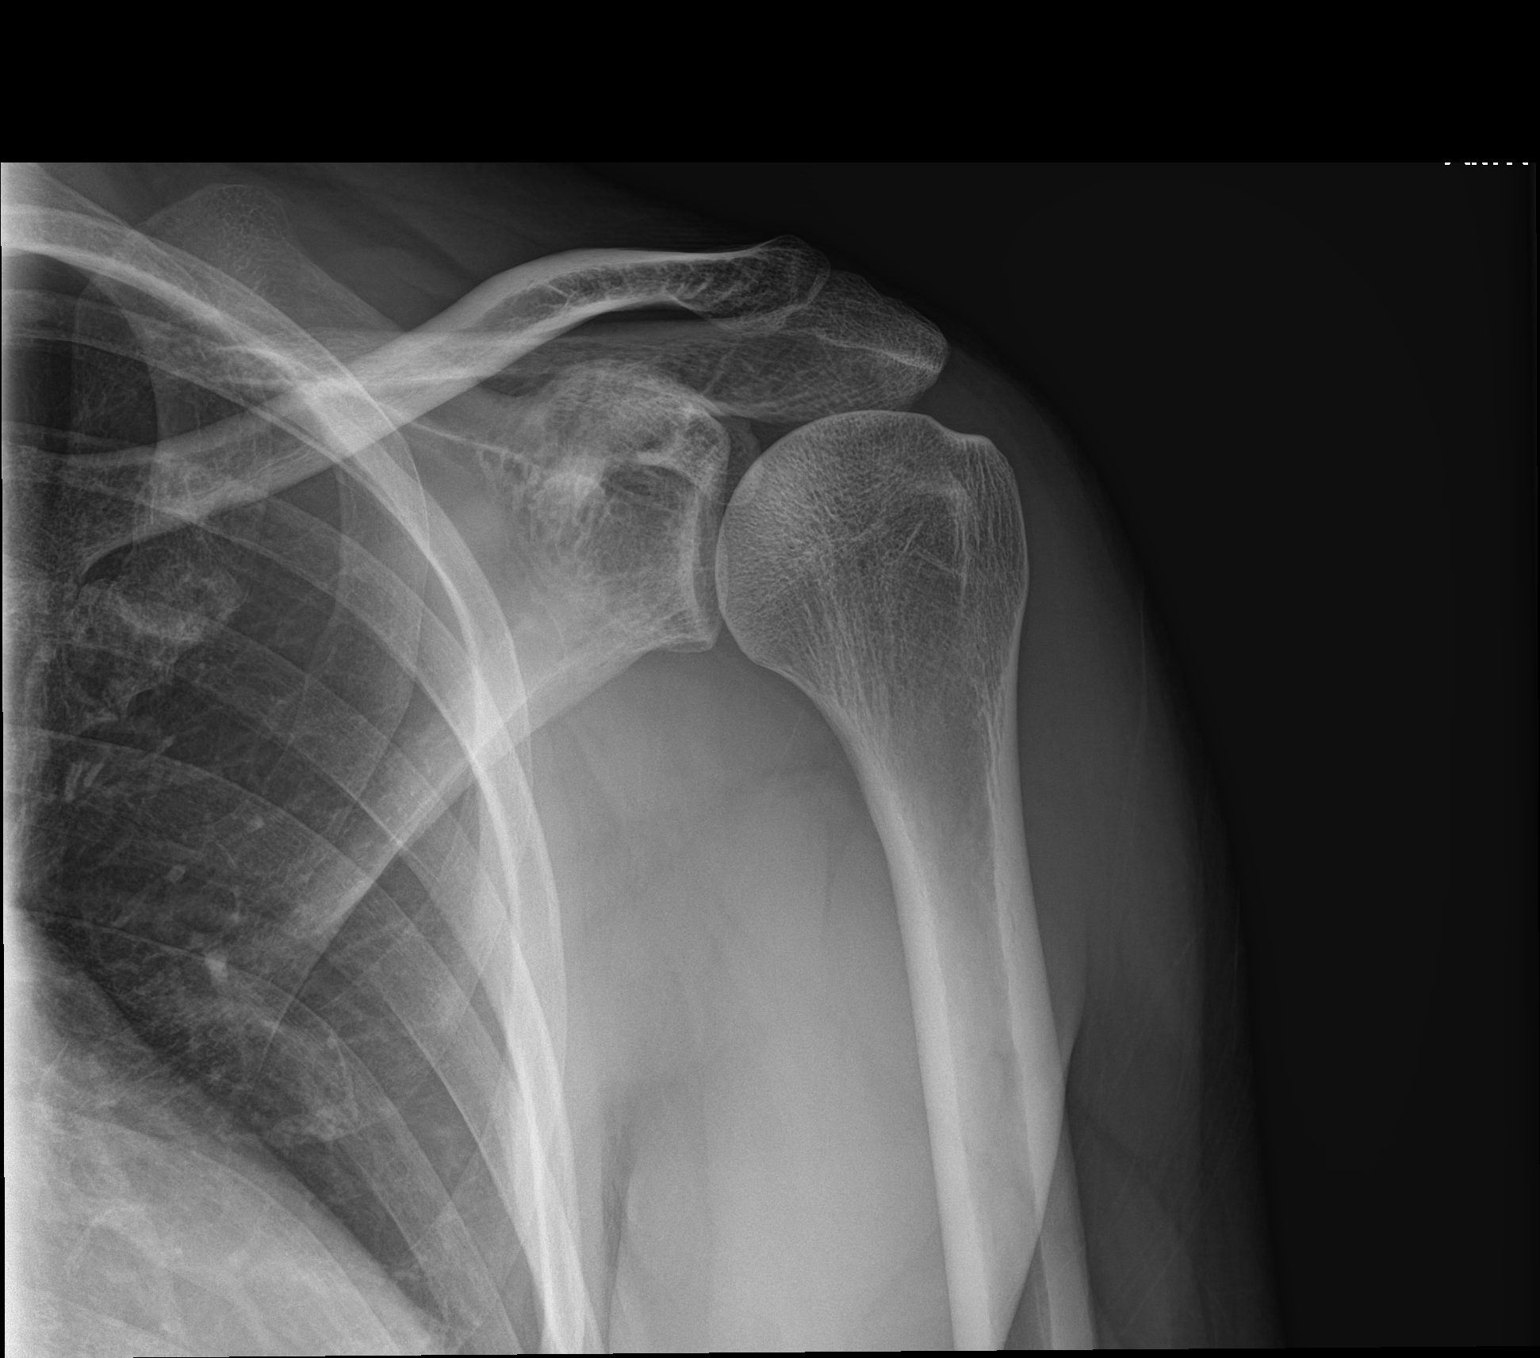

[shoulder y view]
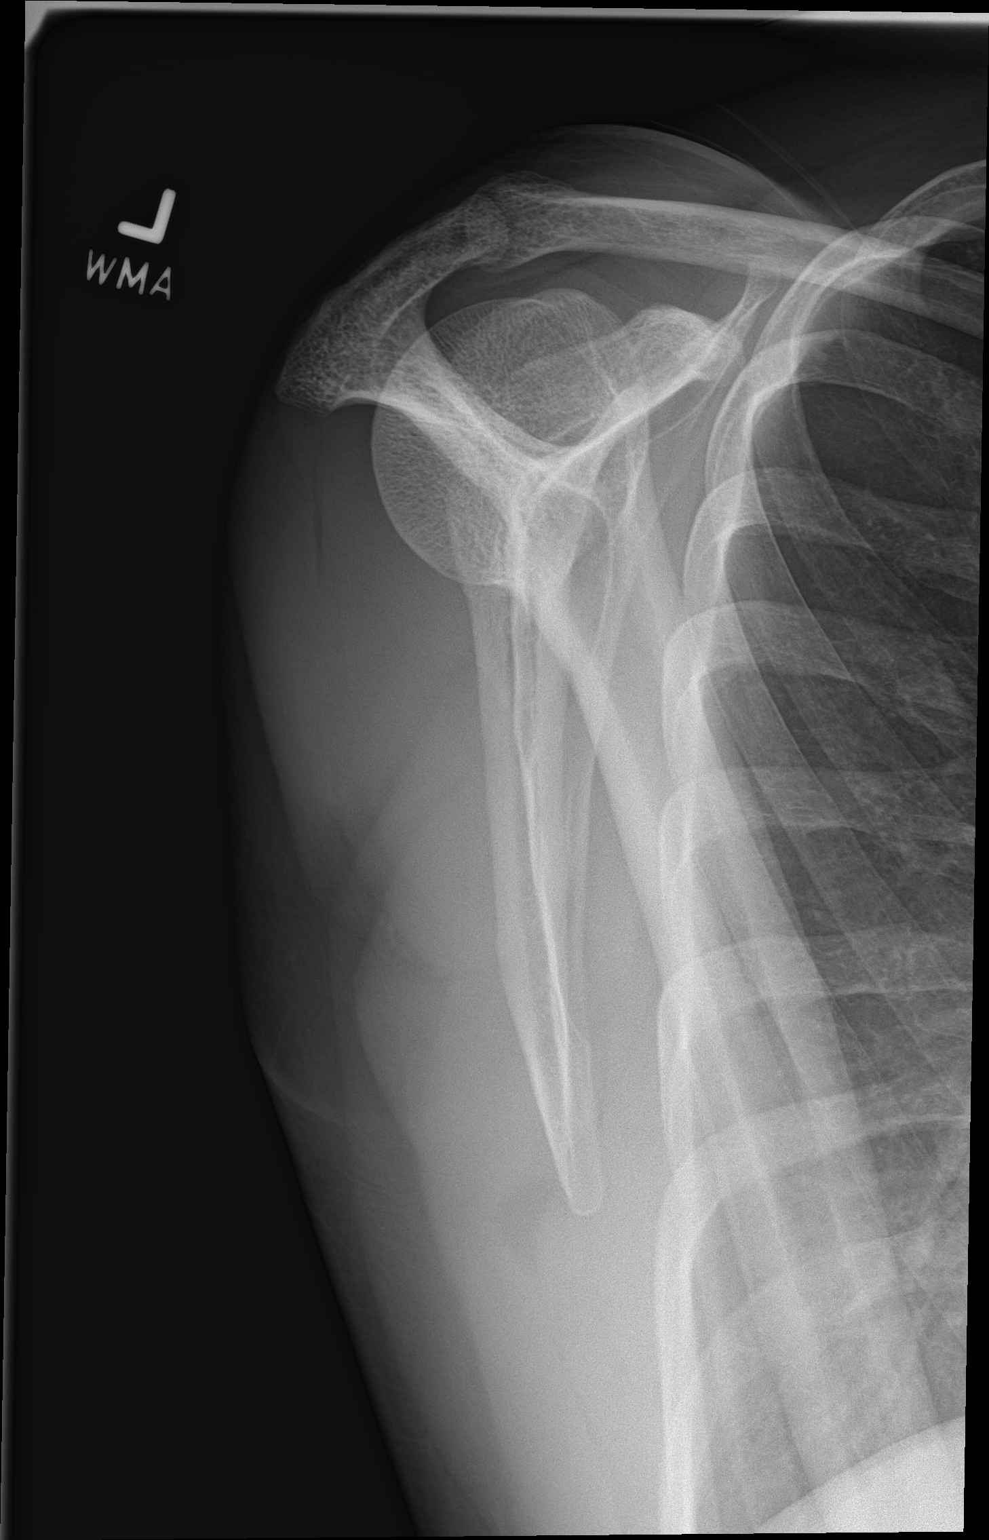

[shoulder axial]
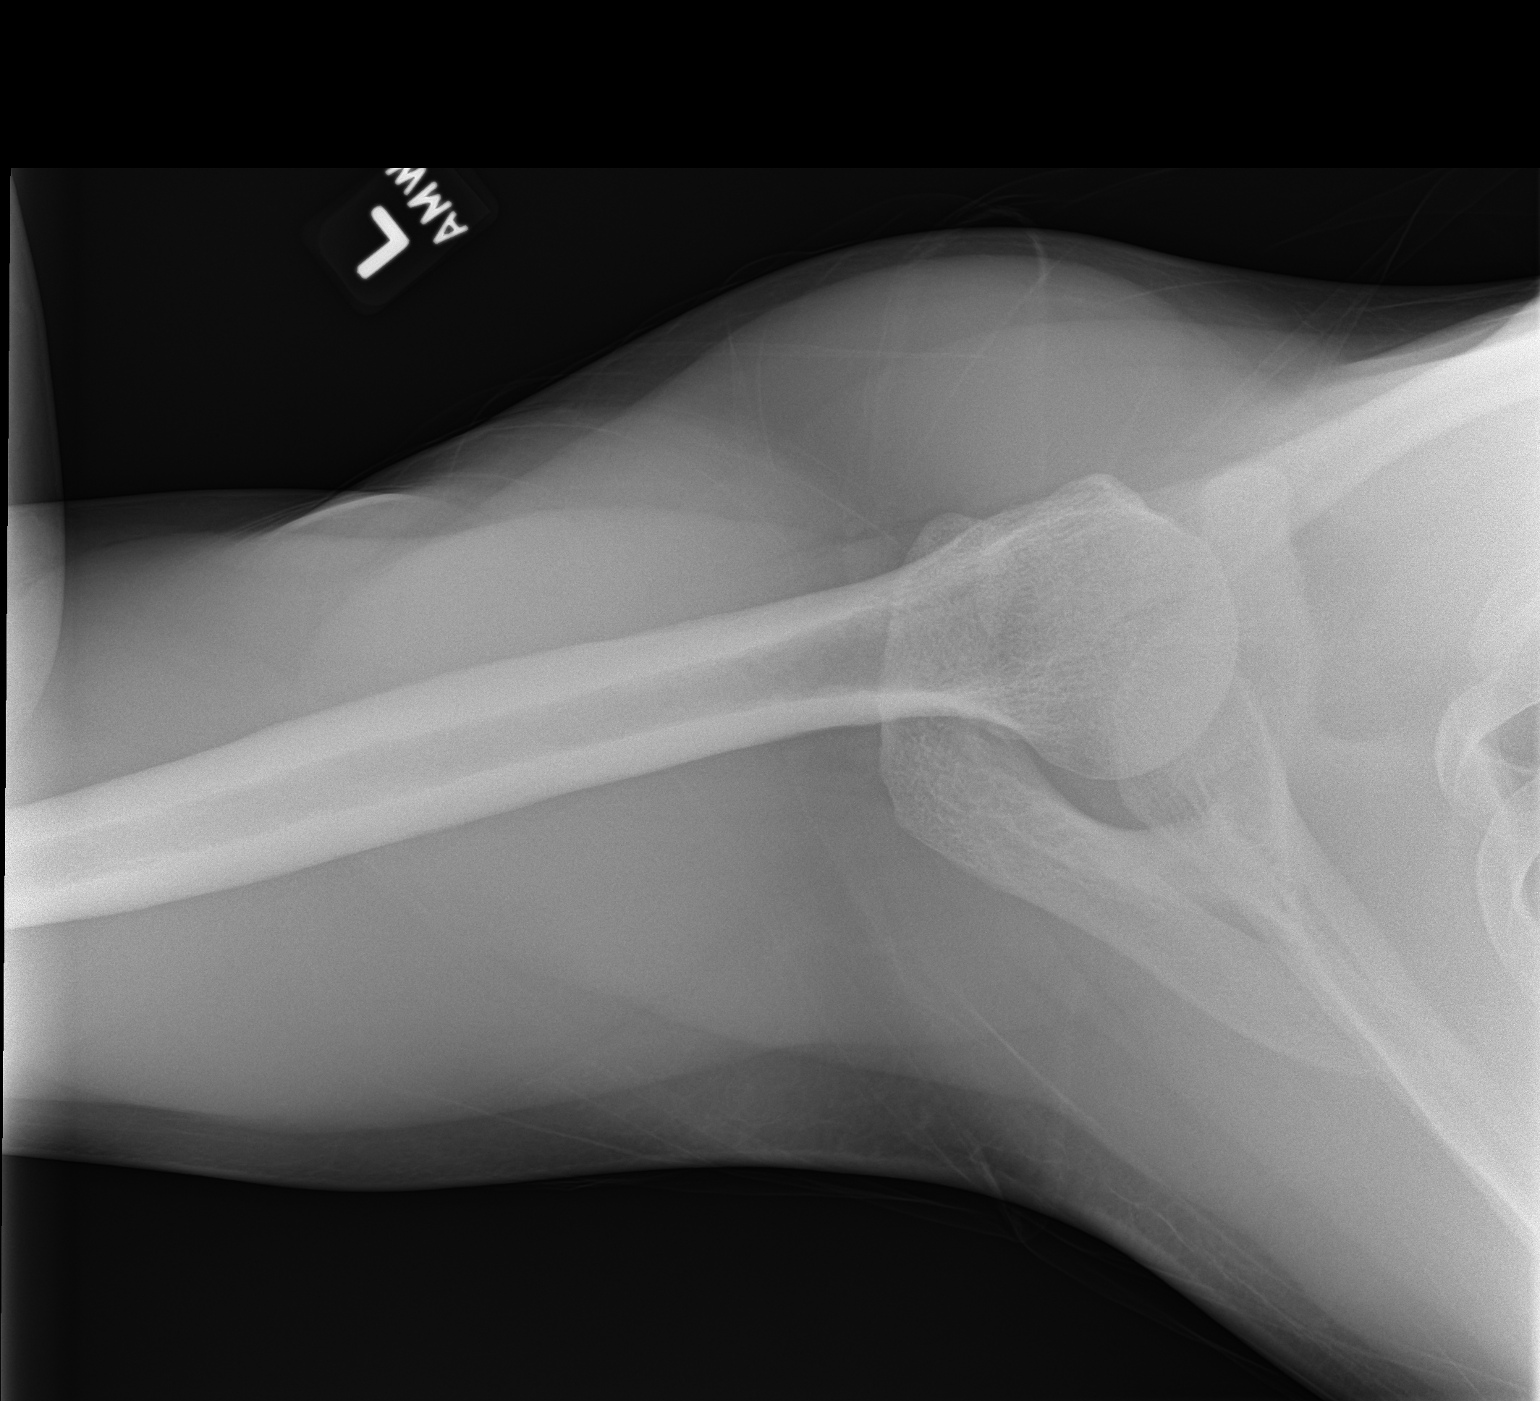

[3 of 3 positions shown; findings below may reference images not displayed]

FINDINGS: There is no evidence of fracture or dislocation. There is no
evidence of arthropathy or other focal bone abnormality. Soft
tissues are unremarkable. Visualized lung fields are clear.
IMPRESSION: No acute osseous abnormality.

## 2022-01-04 MED ORDER — PREDNISONE 10 MG PO TABS: ORAL_TABLET | ORAL | 0 refills | Status: AC

## 2022-01-04 MED ORDER — CYCLOBENZAPRINE HCL 10 MG PO TABS: 10.0000 mg | ORAL_TABLET | Freq: Every day | ORAL | 0 refills | Status: DC

## 2022-01-04 NOTE — Patient Instructions (Signed)
Muscle relaxant at bedtime (flexeril)  Use heat if helpful.  Take steroid taper - hold Advil until taper complete.

## 2022-01-04 NOTE — Progress Notes (Signed)
Date:  01/04/2022   Name:  Kevin Spears   DOB:  1985/08/03   MRN:  [id number]   Chief Complaint: Back Pain (Started 1 month ago. Hurts in lower right back and radiating down into right leg. Numbness and tingling. Carrying things, and standing for long periods of times makes it worse. Sitting and laying is better.)  Back Pain This is a new problem. The current episode started 1 to 4 weeks ago. The problem occurs daily. The problem is unchanged. The pain is present in the lumbar spine. The quality of the pain is described as aching and shooting. The pain radiates to the right foot (numbness). The pain is mild. The symptoms are aggravated by standing and twisting. Associated symptoms include numbness. Pertinent negatives include no bladder incontinence, bowel incontinence, chest pain, fever, leg pain, weakness or weight loss.    Lab Results  Component Value Date   NA 141 07/29/2021   K 4.0 07/29/2021   CO2 23 07/29/2021   GLUCOSE 90 07/29/2021   BUN 14 07/29/2021   CREATININE 1.00 07/29/2021   CALCIUM 9.5 07/29/2021   EGFR 99 07/29/2021   GFRNONAA 90 05/04/2019   Lab Results  Component Value Date   CHOL 182 07/29/2021   HDL 63 07/29/2021   LDLCALC 106 (H) 07/29/2021   TRIG 67 07/29/2021   CHOLHDL 2.9 07/29/2021   Lab Results  Component Value Date   TSH 2.660 07/29/2021   No results found for: "HGBA1C" Lab Results  Component Value Date   WBC 6.9 07/29/2021   HGB 14.2 07/29/2021   HCT 42.1 07/29/2021   MCV 87 07/29/2021   PLT 249 07/29/2021   Lab Results  Component Value Date   ALT 21 07/29/2021   AST 23 07/29/2021   ALKPHOS 65 07/29/2021   BILITOT 0.3 07/29/2021   No results found for: "25OHVITD2", "25OHVITD3", "VD25OH"   Review of Systems  Constitutional:  Negative for chills, fatigue, fever and weight loss.  Respiratory:  Negative for chest tightness and shortness of breath.   Cardiovascular:  Negative for chest pain and leg swelling.  Gastrointestinal:   Negative for bowel incontinence.  Genitourinary:  Negative for bladder incontinence.  Musculoskeletal:  Positive for back pain.  Neurological:  Positive for numbness. Negative for weakness.    Patient Active Problem List   Diagnosis Date Noted   Biceps tendinitis, left 07/04/2020   Internal derangement of left shoulder 07/04/2020   Environmental and seasonal allergies 03/13/2018   Elevated low-density lipoprotein level 11/19/2014   History of OCD (obsessive compulsive disorder) 11/19/2014    Allergies  Allergen Reactions   Erythromycin Rash   Penicillins Rash    Past Surgical History:  Procedure Laterality Date   EYE MUSCLE SURGERY     WISDOM TOOTH EXTRACTION      Social History   Tobacco Use   Smoking status: Former    Types: Cigars   Smokeless tobacco: Never  Vaping Use   Vaping Use: Never used  Substance Use Topics   Alcohol use: Yes    Alcohol/week: 6.0 standard drinks of alcohol    Types: 2 Cans of beer, 4 Standard drinks or equivalent per week   Drug use: Never     Medication list has been reviewed and updated.  Current Meds  Medication Sig   ibuprofen (ADVIL) 600 MG tablet Take 600 mg by mouth every 6 (six) hours as needed.   [DISCONTINUED] diphenhydrAMINE-APAP, sleep, (TYLENOL PM EXTRA STRENGTH PO) Take by mouth  as needed.       01/04/2022   10:24 AM 07/29/2021    9:15 AM 07/24/2020    9:52 AM 05/04/2019   10:26 AM  GAD 7 : Generalized Anxiety Score  Nervous, Anxious, on Edge 0 1 0 1  Control/stop worrying 0 0 0 0  Worry too much - different things 0 0 0 0  Trouble relaxing 0 0 0 0  Restless 0 0 0 0  Easily annoyed or irritable 1 0 0 1  Afraid - awful might happen 0 0 0 0  Total GAD 7 Score 1 1 0 2  Anxiety Difficulty Not difficult at all Not difficult at all  Not difficult at all       01/04/2022   10:24 AM 07/29/2021    9:14 AM 07/24/2020    9:52 AM  Depression screen PHQ 2/9  Decreased Interest 0 0 0  Down, Depressed, Hopeless 0 0 0  PHQ  - 2 Score 0 0 0  Altered sleeping 0 1 1  Tired, decreased energy 0 0 0  Change in appetite 0 0 0  Feeling bad or failure about yourself  0 0 0  Trouble concentrating 0 0 0  Moving slowly or fidgety/restless 0 0 0  Suicidal thoughts  0 0  PHQ-9 Score 0 1 1  Difficult doing work/chores Not difficult at all Not difficult at all Not difficult at all    BP Readings from Last 3 Encounters:  01/04/22 126/78  07/29/21 100/68  07/24/20 110/80    Physical Exam Vitals and nursing note reviewed.  Constitutional:      General: He is not in acute distress.    Appearance: He is well-developed.  HENT:     Head: Normocephalic and atraumatic.  Cardiovascular:     Rate and Rhythm: Normal rate and regular rhythm.  Pulmonary:     Effort: Pulmonary effort is normal. No respiratory distress.     Breath sounds: No wheezing or rhonchi.  Musculoskeletal:     Lumbar back: Spasms present. No bony tenderness. Normal range of motion. Positive right straight leg raise test.       Back:     Comments: Tight muscles - non tender  Skin:    General: Skin is warm and dry.     Findings: No rash.  Neurological:     Mental Status: He is alert and oriented to person, place, and time.     Sensory: Sensation is intact.     Motor: Motor function is intact.     Deep Tendon Reflexes:     Reflex Scores:      Patellar reflexes are 1+ on the right side and 1+ on the left side. Psychiatric:        Mood and Affect: Mood normal.        Behavior: Behavior normal.     Wt Readings from Last 3 Encounters:  01/04/22 179 lb 6.4 oz (81.4 kg)  07/29/21 185 lb (83.9 kg)  07/24/20 212 lb (96.2 kg)    BP 126/78   Pulse 64   Ht _0  (1.803 m)   Wt 179 lb 6.4 oz (81.4 kg)   SpO2 98%   BMI 25.02 kg/m   Assessment and Plan: 1. Sciatica of right side Avoid heavy lifting May need imaging if symptoms persist - ibuprofen (ADVIL) 600 MG tablet; Take 600 mg by mouth every 6 (six) hours as needed. - predniSONE  (DELTASONE) 10 MG tablet; Take 6 tablets (60  mg total) by mouth daily with breakfast for 1 day, THEN 5 tablets (50 mg total) daily with breakfast for 1 day, THEN 4 tablets (40 mg total) daily with breakfast for 1 day, THEN 3 tablets (30 mg total) daily with breakfast for 1 day, THEN 2 tablets (20 mg total) daily with breakfast for 1 day, THEN 1 tablet (10 mg total) daily with breakfast for 1 day.  Dispense: 21 tablet; Refill: 0  2. Muscle spasm of back Recommend heat and flexeril at HS - cyclobenzaprine (FLEXERIL) 10 MG tablet; Take 1 tablet (10 mg total) by mouth at bedtime.  Dispense: 30 tablet; Refill: 0   Partially dictated using Editor, commissioning. Any errors are unintentional.  Halina Maidens, MD [facility]  01/04/2022

## 2022-01-20 ENCOUNTER — Ambulatory Visit: Payer: 59 | Admitting: Internal Medicine

## 2022-04-07 DIAGNOSIS — H5203 Hypermetropia, bilateral: Secondary | ICD-10-CM | POA: Diagnosis not present

## 2022-07-19 ENCOUNTER — Telehealth (INDEPENDENT_AMBULATORY_CARE_PROVIDER_SITE_OTHER): Payer: Commercial Managed Care - PPO | Admitting: Nurse Practitioner

## 2022-07-19 ENCOUNTER — Encounter: Payer: Self-pay | Admitting: Nurse Practitioner

## 2022-07-19 ENCOUNTER — Other Ambulatory Visit: Payer: Self-pay

## 2022-07-19 DIAGNOSIS — U071 COVID-19: Secondary | ICD-10-CM

## 2022-07-19 MED ORDER — NIRMATRELVIR/RITONAVIR (PAXLOVID)TABLET
3.0000 | ORAL_TABLET | Freq: Two times a day (BID) | ORAL | 0 refills | Status: AC
  Filled 2022-07-19: qty 30, 5d supply, fill #0

## 2022-07-19 MED ORDER — NIRMATRELVIR/RITONAVIR (PAXLOVID)TABLET: 3.0000 | ORAL_TABLET | Freq: Two times a day (BID) | ORAL | 0 refills | Status: DC

## 2022-07-19 NOTE — Progress Notes (Signed)
There were no vitals taken for this visit.   Subjective:    Patient ID: Kevin Spears, male    DOB: 11/11/1985, 37 y.o.   MRN: [id number]  HPI: Kevin Spears is a 37 y.o. male  No chief complaint on file.  UPPER RESPIRATORY TRACT INFECTION Worst symptom: COVID positive on Saturday Fever: no Cough: yes Shortness of breath: no Wheezing: no Chest pain: no Chest tightness: no Chest congestion: no Nasal congestion: yes Runny nose: yes Post nasal drip: no Sneezing: no Sore throat: no Swollen glands: no Sinus pressure: no Headache: yes Face pain: no Toothache: no Ear pain: no bilateral Ear pressure: no bilateral Eyes red/itching:no Eye drainage/crusting: no  Vomiting: no Rash: no Fatigue: yes Sick contacts: no Strep contacts: no  Context: stable Recurrent sinusitis: no Relief with OTC cold/cough medications: yes  Treatments attempted:  ibuprofen    Relevant past medical, surgical, family and social history reviewed and updated as indicated. Interim medical history since our last visit reviewed. Allergies and medications reviewed and updated.  Review of Systems  Constitutional:  Positive for fatigue. Negative for fever.  HENT:  Positive for congestion, postnasal drip and rhinorrhea. Negative for ear pain, sinus pressure, sinus pain, sneezing and sore throat.   Respiratory:  Positive for cough. Negative for chest tightness, shortness of breath and wheezing.   Gastrointestinal:  Negative for vomiting.  Skin:  Negative for rash.  Neurological:  Positive for headaches.    Per HPI unless specifically indicated above     Objective:    There were no vitals taken for this visit.  Wt Readings from Last 3 Encounters:  01/04/22 179 lb 6.4 oz (81.4 kg)  07/29/21 185 lb (83.9 kg)  07/24/20 212 lb (96.2 kg)    Physical Exam Vitals and nursing note reviewed.  Constitutional:      General: He is not in acute distress.    Appearance: He is not ill-appearing.  HENT:      Head: Normocephalic.     Right Ear: Hearing normal.     Left Ear: Hearing normal.     Nose: Nose normal.  Pulmonary:     Effort: Pulmonary effort is normal. No respiratory distress.  Neurological:     Mental Status: He is alert.  Psychiatric:        Mood and Affect: Mood normal.        Behavior: Behavior normal.        Thought Content: Thought content normal.        Judgment: Judgment normal.     Results for orders placed or performed in visit on 07/29/21  CBC with Differential/Platelet  Result Value Ref Range   WBC 6.9 3.4 - 10.8 x10E3/uL   RBC 4.83 4.14 - 5.80 x10E6/uL   Hemoglobin 14.2 13.0 - 17.7 g/dL   Hematocrit 40.9 81.1 - 51.0 %   MCV 87 79 - 97 fL   MCH 29.4 26.6 - 33.0 pg   MCHC 33.7 31.5 - 35.7 g/dL   RDW 91.4 78.2 - 95.6 %   Platelets 249 150 - 450 x10E3/uL   Neutrophils 49 Not Estab. %   Lymphs 39 Not Estab. %   Monocytes 10 Not Estab. %   Eos 2 Not Estab. %   Basos 0 Not Estab. %   Neutrophils Absolute 3.3 1.4 - 7.0 x10E3/uL   Lymphocytes Absolute 2.7 0.7 - 3.1 x10E3/uL   Monocytes Absolute 0.7 0.1 - 0.9 x10E3/uL   EOS (ABSOLUTE) 0.2 0.0 -  0.4 x10E3/uL   Basophils Absolute 0.0 0.0 - 0.2 x10E3/uL   Immature Granulocytes 0 Not Estab. %   Immature Grans (Abs) 0.0 0.0 - 0.1 x10E3/uL  Comprehensive metabolic panel  Result Value Ref Range   Glucose 90 70 - 99 mg/dL   BUN 14 6 - 20 mg/dL   Creatinine, Ser 8.29 0.76 - 1.27 mg/dL   eGFR 99 >56 OZ/HYQ/6.57   BUN/Creatinine Ratio 14 9 - 20   Sodium 141 134 - 144 mmol/L   Potassium 4.0 3.5 - 5.2 mmol/L   Chloride 104 96 - 106 mmol/L   CO2 23 20 - 29 mmol/L   Calcium 9.5 8.7 - 10.2 mg/dL   Total Protein 6.8 6.0 - 8.5 g/dL   Albumin 4.5 4.0 - 5.0 g/dL   Globulin, Total 2.3 1.5 - 4.5 g/dL   Albumin/Globulin Ratio 2.0 1.2 - 2.2   Bilirubin Total 0.3 0.0 - 1.2 mg/dL   Alkaline Phosphatase 65 44 - 121 IU/L   AST 23 0 - 40 IU/L   ALT 21 0 - 44 IU/L  Lipid panel  Result Value Ref Range   Cholesterol, Total  182 100 - 199 mg/dL   Triglycerides 67 0 - 149 mg/dL   HDL 63 >84 mg/dL   VLDL Cholesterol Cal 13 5 - 40 mg/dL   LDL Chol Calc (NIH) 696 (H) 0 - 99 mg/dL   Chol/HDL Ratio 2.9 0.0 - 5.0 ratio  TSH  Result Value Ref Range   TSH 2.660 0.450 - 4.500 uIU/mL  Hepatitis C antibody  Result Value Ref Range   Hep C Virus Ab Non Reactive Non Reactive      Assessment & Plan:   Problem List Items Addressed This Visit   None Visit Diagnoses     COVID-19    -  Primary   Will treat with Paxlovid. Complete course of antiviral. Continue with OTC symptom management. Recommend staying well hydrated and resting.   Relevant Medications   nirmatrelvir/ritonavir (PAXLOVID) 20 x 150 MG & 10 x 100MG  TABS        Follow up plan: No follow-ups on file.   This visit was completed via MyChart due to the restrictions of the COVID-19 pandemic. All issues as above were discussed and addressed. Physical exam was done as above through visual confirmation on MyChart. If it was felt that the patient should be evaluated in the office, they were directed there. The patient verbally consented to this visit. Location of the patient: Home Location of the provider: Office Those involved with this call:  Provider: Larae Grooms, NP CMA: Wilhemena Durie, CMA Front Desk/Registration: Servando Snare This encounter was conducted via video.  I spent 20 dedicated to the care of this patient on the date of this encounter to include previsit review of symptoms, plan of care and follow up, face to face time with the patient, and post visit ordering of testing.

## 2022-08-02 ENCOUNTER — Encounter: Payer: 59 | Admitting: Internal Medicine

## 2022-11-17 ENCOUNTER — Encounter: Payer: 59 | Admitting: Internal Medicine

## 2022-12-15 ENCOUNTER — Telehealth (INDEPENDENT_AMBULATORY_CARE_PROVIDER_SITE_OTHER): Payer: Commercial Managed Care - PPO | Admitting: Nurse Practitioner

## 2022-12-15 ENCOUNTER — Encounter: Payer: Self-pay | Admitting: Nurse Practitioner

## 2022-12-15 DIAGNOSIS — J0100 Acute maxillary sinusitis, unspecified: Secondary | ICD-10-CM

## 2022-12-15 MED ORDER — DOXYCYCLINE HYCLATE 100 MG PO TABS: 100.0000 mg | ORAL_TABLET | Freq: Two times a day (BID) | ORAL | 0 refills | Status: DC

## 2022-12-15 NOTE — Progress Notes (Signed)
There were no vitals taken for this visit.   Subjective:    Patient ID: Kevin Spears, male    DOB: 1985/07/03, 37 y.o.   MRN: [id number]  HPI: Kevin Spears is a 37 y.o. male  No chief complaint on file.  UPPER RESPIRATORY TRACT INFECTION Worst symptom: symptoms started over a week ago.  Particularly bad on Thursday and Friday and now having sinus pressure in his head with congestion.  Fever: yes- low grade Cough: yes Shortness of breath: no Wheezing: no Chest pain: no Chest tightness: no Chest congestion: no Nasal congestion: yes Runny nose: no Post nasal drip: yes Sneezing: yes Sore throat: yes Swollen glands: no Sinus pressure: yes Headache: yes Face pain: no Toothache: no Ear pain: no bilateral Ear pressure: no bilateral Eyes red/itching:no Eye drainage/crusting: no  Vomiting: no Rash: no Fatigue: yes Sick contacts: no Strep contacts: no  Context: stable Recurrent sinusitis: no   Relevant past medical, surgical, family and social history reviewed and updated as indicated. Interim medical history since our last visit reviewed. Allergies and medications reviewed and updated.  Review of Systems  Constitutional:  Positive for fatigue and fever.  HENT:  Positive for congestion, postnasal drip, rhinorrhea, sinus pressure, sinus pain, sneezing and sore throat. Negative for ear pain.   Respiratory:  Positive for cough. Negative for chest tightness, shortness of breath and wheezing.   Gastrointestinal:  Negative for vomiting.  Skin:  Negative for rash.  Neurological:  Positive for headaches.    Per HPI unless specifically indicated above     Objective:    There were no vitals taken for this visit.  Wt Readings from Last 3 Encounters:  01/04/22 179 lb 6.4 oz (81.4 kg)  07/29/21 185 lb (83.9 kg)  07/24/20 212 lb (96.2 kg)    Physical Exam Vitals and nursing note reviewed.  Constitutional:      General: He is not in acute distress.    Appearance: He  is not ill-appearing.  HENT:     Head: Normocephalic.     Right Ear: Hearing normal.     Left Ear: Hearing normal.     Nose: Nose normal.  Pulmonary:     Effort: Pulmonary effort is normal. No respiratory distress.  Neurological:     Mental Status: He is alert.  Psychiatric:        Mood and Affect: Mood normal.        Behavior: Behavior normal.        Thought Content: Thought content normal.        Judgment: Judgment normal.     Results for orders placed or performed in visit on 07/29/21  CBC with Differential/Platelet  Result Value Ref Range   WBC 6.9 3.4 - 10.8 x10E3/uL   RBC 4.83 4.14 - 5.80 x10E6/uL   Hemoglobin 14.2 13.0 - 17.7 g/dL   Hematocrit 86.5 78.4 - 51.0 %   MCV 87 79 - 97 fL   MCH 29.4 26.6 - 33.0 pg   MCHC 33.7 31.5 - 35.7 g/dL   RDW 69.6 29.5 - 28.4 %   Platelets 249 150 - 450 x10E3/uL   Neutrophils 49 Not Estab. %   Lymphs 39 Not Estab. %   Monocytes 10 Not Estab. %   Eos 2 Not Estab. %   Basos 0 Not Estab. %   Neutrophils Absolute 3.3 1.4 - 7.0 x10E3/uL   Lymphocytes Absolute 2.7 0.7 - 3.1 x10E3/uL   Monocytes Absolute 0.7 0.1 - 0.9  x10E3/uL   EOS (ABSOLUTE) 0.2 0.0 - 0.4 x10E3/uL   Basophils Absolute 0.0 0.0 - 0.2 x10E3/uL   Immature Granulocytes 0 Not Estab. %   Immature Grans (Abs) 0.0 0.0 - 0.1 x10E3/uL  Comprehensive metabolic panel  Result Value Ref Range   Glucose 90 70 - 99 mg/dL   BUN 14 6 - 20 mg/dL   Creatinine, Ser 1.61 0.76 - 1.27 mg/dL   eGFR 99 >09 UE/AVW/0.98   BUN/Creatinine Ratio 14 9 - 20   Sodium 141 134 - 144 mmol/L   Potassium 4.0 3.5 - 5.2 mmol/L   Chloride 104 96 - 106 mmol/L   CO2 23 20 - 29 mmol/L   Calcium 9.5 8.7 - 10.2 mg/dL   Total Protein 6.8 6.0 - 8.5 g/dL   Albumin 4.5 4.0 - 5.0 g/dL   Globulin, Total 2.3 1.5 - 4.5 g/dL   Albumin/Globulin Ratio 2.0 1.2 - 2.2   Bilirubin Total 0.3 0.0 - 1.2 mg/dL   Alkaline Phosphatase 65 44 - 121 IU/L   AST 23 0 - 40 IU/L   ALT 21 0 - 44 IU/L  Lipid panel  Result Value  Ref Range   Cholesterol, Total 182 100 - 199 mg/dL   Triglycerides 67 0 - 149 mg/dL   HDL 63 >11 mg/dL   VLDL Cholesterol Cal 13 5 - 40 mg/dL   LDL Chol Calc (NIH) 914 (H) 0 - 99 mg/dL   Chol/HDL Ratio 2.9 0.0 - 5.0 ratio  TSH  Result Value Ref Range   TSH 2.660 0.450 - 4.500 uIU/mL  Hepatitis C antibody  Result Value Ref Range   Hep C Virus Ab Non Reactive Non Reactive      Assessment & Plan:   Problem List Items Addressed This Visit   None Visit Diagnoses     Acute non-recurrent maxillary sinusitis    -  Primary   Will treat with Doxycyline due to allergies.  Complete course of medication.  Recommend resting and staying well hydrated.   Relevant Medications   doxycycline (VIBRA-TABS) 100 MG tablet        Follow up plan: Return if symptoms worsen or fail to improve.  This visit was completed via MyChart due to the restrictions of the COVID-19 pandemic. All issues as above were discussed and addressed. Physical exam was done as above through visual confirmation on MyChart. If it was felt that the patient should be evaluated in the office, they were directed there. The patient verbally consented to this visit. Location of the patient: Home Location of the provider: Office Those involved with this call:  Provider: Larae Grooms, NP CMA: None Front Desk/Registration: Servando Snare This encounter was conducted via video.  I spent 20 dedicated to the care of this patient on the date of this encounter to include previsit review of symptoms, plan of care and follow up, face to face time with the patient, and post visit ordering of testing.

## 2022-12-20 ENCOUNTER — Ambulatory Visit: Payer: Commercial Managed Care - PPO | Admitting: Internal Medicine

## 2023-03-26 ENCOUNTER — Telehealth: Payer: Self-pay | Admitting: Nurse Practitioner

## 2023-03-26 MED ORDER — OSELTAMIVIR PHOSPHATE 75 MG PO CAPS: 75.0000 mg | ORAL_CAPSULE | Freq: Every day | ORAL | 0 refills | Status: DC

## 2023-03-26 NOTE — Telephone Encounter (Signed)
 Patient's wife tested positive for flu A. Requesting Tamiflu prophylaxis.

## 2023-04-07 ENCOUNTER — Ambulatory Visit (INDEPENDENT_AMBULATORY_CARE_PROVIDER_SITE_OTHER): Payer: Self-pay | Admitting: Internal Medicine

## 2023-04-07 ENCOUNTER — Encounter: Payer: Self-pay | Admitting: Internal Medicine

## 2023-04-07 VITALS — BP 118/78 | HR 95 | Ht 71.0 in | Wt 175.1 lb

## 2023-04-07 DIAGNOSIS — Z0000 Encounter for general adult medical examination without abnormal findings: Secondary | ICD-10-CM

## 2023-04-07 DIAGNOSIS — E7800 Pure hypercholesterolemia, unspecified: Secondary | ICD-10-CM | POA: Diagnosis not present

## 2023-04-07 NOTE — Assessment & Plan Note (Signed)
 Continue work on diet, exercise, weight control. Lab Results  Component Value Date   LDLCALC 106 (H) 07/29/2021

## 2023-04-07 NOTE — Progress Notes (Signed)
 Date:  04/07/2023   Name:  Kevin Spears   DOB:  04/08/1985   MRN:  [id number]   Chief Complaint: Annual Exam Kevin Spears is a 38 y.o. male who presents today for his Complete Annual Exam. He feels well. He reports exercising exercise bike for 30 minutes, lefts weights, upper body workouts at home, 5 -7 days a week. He reports he is sleeping fairly well.   Health Maintenance  Topic Date Due   HIV Screening  Never done   COVID-19 Vaccine (5 - 2024-25 season) 04/23/2023*   Flu Shot  04/25/2023*   DTaP/Tdap/Td vaccine (3 - Td or Tdap) 07/25/2030   Hepatitis C Screening  Completed   HPV Vaccine  Aged Out  *Topic was postponed. The date shown is not the original due date.    No results found for: "PSA1", "PSA"   HPI  Review of Systems  Constitutional:  Negative for fatigue and unexpected weight change.  HENT:  Negative for nosebleeds.   Eyes:  Negative for visual disturbance.  Respiratory:  Negative for cough, chest tightness, shortness of breath and wheezing.   Cardiovascular:  Negative for chest pain, palpitations and leg swelling.  Gastrointestinal:  Negative for abdominal pain, constipation and diarrhea.  Genitourinary:  Negative for hematuria and urgency.  Musculoskeletal:  Positive for back pain.  Skin:  Negative for color change and wound.  Neurological:  Negative for dizziness, weakness, light-headedness and headaches.  Psychiatric/Behavioral:  Negative for dysphoric mood and sleep disturbance. The patient is not nervous/anxious.      Lab Results  Component Value Date   NA 141 07/29/2021   K 4.0 07/29/2021   CO2 23 07/29/2021   GLUCOSE 90 07/29/2021   BUN 14 07/29/2021   CREATININE 1.00 07/29/2021   CALCIUM 9.5 07/29/2021   EGFR 99 07/29/2021   GFRNONAA 90 05/04/2019   Lab Results  Component Value Date   CHOL 182 07/29/2021   HDL 63 07/29/2021   LDLCALC 106 (H) 07/29/2021   TRIG 67 07/29/2021   CHOLHDL 2.9 07/29/2021   Lab Results  Component Value  Date   TSH 2.660 07/29/2021   No results found for: "HGBA1C" Lab Results  Component Value Date   WBC 6.9 07/29/2021   HGB 14.2 07/29/2021   HCT 42.1 07/29/2021   MCV 87 07/29/2021   PLT 249 07/29/2021   Lab Results  Component Value Date   ALT 21 07/29/2021   AST 23 07/29/2021   ALKPHOS 65 07/29/2021   BILITOT 0.3 07/29/2021   No results found for: "25OHVITD2", "25OHVITD3", "VD25OH"   Patient Active Problem List   Diagnosis Date Noted   Biceps tendinitis, left 07/04/2020   Internal derangement of left shoulder 07/04/2020   Environmental and seasonal allergies 03/13/2018   Elevated low-density lipoprotein level 11/19/2014   History of OCD (obsessive compulsive disorder) 11/19/2014    Allergies  Allergen Reactions   Erythromycin Rash   Penicillins Rash    Past Surgical History:  Procedure Laterality Date   EYE MUSCLE SURGERY     WISDOM TOOTH EXTRACTION      Social History   Tobacco Use   Smoking status: Former    Types: Cigars   Smokeless tobacco: Never  Vaping Use   Vaping status: Never Used  Substance Use Topics   Alcohol use: Yes    Alcohol/week: 6.0 standard drinks of alcohol    Types: 2 Cans of beer, 4 Standard drinks or equivalent per week   Drug  use: Never     Medication list has been reviewed and updated.  Current Meds  Medication Sig   ibuprofen (ADVIL) 600 MG tablet Take 600 mg by mouth every 6 (six) hours as needed.       04/07/2023   11:03 AM 01/04/2022   10:24 AM 07/29/2021    9:15 AM 07/24/2020    9:52 AM  GAD 7 : Generalized Anxiety Score  Nervous, Anxious, on Edge 0 0 1 0  Control/stop worrying 0 0 0 0  Worry too much - different things 0 0 0 0  Trouble relaxing 0 0 0 0  Restless 0 0 0 0  Easily annoyed or irritable 0 1 0 0  Afraid - awful might happen 0 0 0 0  Total GAD 7 Score 0 1 1 0  Anxiety Difficulty Not difficult at all Not difficult at all Not difficult at all        04/07/2023   11:03 AM 01/04/2022   10:24 AM  07/29/2021    9:14 AM  Depression screen PHQ 2/9  Decreased Interest 0 0 0  Down, Depressed, Hopeless 0 0 0  PHQ - 2 Score 0 0 0  Altered sleeping 0 0 1  Tired, decreased energy 0 0 0  Change in appetite 0 0 0  Feeling bad or failure about yourself  0 0 0  Trouble concentrating 0 0 0  Moving slowly or fidgety/restless 0 0 0  Suicidal thoughts 0  0  PHQ-9 Score 0 0 1  Difficult doing work/chores Not difficult at all Not difficult at all Not difficult at all    BP Readings from Last 3 Encounters:  04/07/23 118/78  01/04/22 126/78  07/29/21 100/68    Physical Exam Vitals and nursing note reviewed.  Constitutional:      Appearance: Normal appearance. He is well-developed.  HENT:     Head: Normocephalic.     Right Ear: Tympanic membrane, ear canal and external ear normal.     Left Ear: Tympanic membrane, ear canal and external ear normal.     Nose: Nose normal.  Eyes:     Conjunctiva/sclera: Conjunctivae normal.     Pupils: Pupils are equal, round, and reactive to light.  Neck:     Thyroid: No thyromegaly.     Vascular: No carotid bruit.  Cardiovascular:     Rate and Rhythm: Normal rate and regular rhythm.     Pulses: Normal pulses.     Heart sounds: Normal heart sounds.  Pulmonary:     Effort: Pulmonary effort is normal.     Breath sounds: Normal breath sounds. No wheezing.  Chest:  Breasts:    Right: No mass.     Left: No mass.  Abdominal:     General: Bowel sounds are normal.     Palpations: Abdomen is soft.     Tenderness: There is no abdominal tenderness. There is no guarding or rebound.  Musculoskeletal:        General: Normal range of motion.     Cervical back: Normal range of motion and neck supple.     Right lower leg: No edema.     Left lower leg: No edema.  Lymphadenopathy:     Cervical: No cervical adenopathy.  Skin:    General: Skin is warm and dry.     Capillary Refill: Capillary refill takes less than 2 seconds.  Neurological:     General: No  focal deficit present.  Mental Status: He is alert and oriented to person, place, and time.     Gait: Gait normal.     Deep Tendon Reflexes: Reflexes are normal and symmetric. Reflexes normal.  Psychiatric:        Attention and Perception: Attention normal.        Mood and Affect: Mood normal.        Thought Content: Thought content normal.     Wt Readings from Last 3 Encounters:  04/07/23 175 lb 2 oz (79.4 kg)  01/04/22 179 lb 6.4 oz (81.4 kg)  07/29/21 185 lb (83.9 kg)    BP 118/78   Pulse 95   Ht 5\' 11"  (1.803 m)   Wt 175 lb 2 oz (79.4 kg)   SpO2 100%   BMI 24.42 kg/m   Assessment and Plan:  Problem List Items Addressed This Visit       Unprioritized   Elevated low-density lipoprotein level   Continue work on diet, exercise, weight control. Lab Results  Component Value Date   LDLCALC 106 (H) 07/29/2021         Relevant Orders   Lipid panel   Other Visit Diagnoses       Annual physical exam    -  Primary   continue healthy diet, exercise and weight control up to date on recommended screenings/immunizations for age 60 no new family hx   Relevant Orders   CBC with Differential/Platelet   Comprehensive metabolic panel   Lipid panel   TSH       Return in about 1 year (around 04/06/2024) for CPX with new provider.    Reubin Milan, MD [facility] [place]

## 2023-04-08 ENCOUNTER — Encounter: Payer: Self-pay | Admitting: Internal Medicine

## 2023-04-08 LAB — COMPREHENSIVE METABOLIC PANEL
ALT: 17 IU/L (ref 0–44)
AST: 23 IU/L (ref 0–40)
Albumin: 4.6 g/dL (ref 4.1–5.1)
Alkaline Phosphatase: 63 IU/L (ref 44–121)
BUN/Creatinine Ratio: 15 (ref 9–20)
BUN: 15 mg/dL (ref 6–24)
Bilirubin Total: <0.2 mg/dL (ref 0.0–1.2)
CO2: 25 mmol/L (ref 20–29)
Calcium: 10.2 mg/dL (ref 8.7–10.2)
Chloride: 102 mmol/L (ref 96–106)
Creatinine, Ser: 0.99 mg/dL (ref 0.76–1.27)
Globulin, Total: 2.4 g/dL (ref 1.5–4.5)
Glucose: 79 mg/dL (ref 70–99)
Potassium: 5.3 mmol/L — ABNORMAL HIGH (ref 3.5–5.2)
Sodium: 142 mmol/L (ref 134–144)
Total Protein: 7 g/dL (ref 6.0–8.5)
eGFR: 99 mL/min/{1.73_m2} (ref >59)

## 2023-04-08 LAB — LIPID PANEL
Chol/HDL Ratio: 3.1 ratio (ref 0.0–5.0)
Cholesterol, Total: 194 mg/dL (ref 100–199)
HDL: 63 mg/dL (ref >39)
LDL Chol Calc (NIH): 115 mg/dL — ABNORMAL HIGH (ref 0–99)
Triglycerides: 90 mg/dL (ref 0–149)
VLDL Cholesterol Cal: 16 mg/dL (ref 5–40)

## 2023-04-08 LAB — CBC WITH DIFFERENTIAL/PLATELET
Basophils Absolute: 0 10*3/uL (ref 0.0–0.2)
Basos: 0 %
EOS (ABSOLUTE): 0.1 10*3/uL (ref 0.0–0.4)
Eos: 1 %
Hematocrit: 41.5 % (ref 37.5–51.0)
Hemoglobin: 13.7 g/dL (ref 13.0–17.7)
Immature Grans (Abs): 0 10*3/uL (ref 0.0–0.1)
Immature Granulocytes: 0 %
Lymphocytes Absolute: 2.1 10*3/uL (ref 0.7–3.1)
Lymphs: 41 %
MCH: 29.7 pg (ref 26.6–33.0)
MCHC: 33 g/dL (ref 31.5–35.7)
MCV: 90 fL (ref 79–97)
Monocytes Absolute: 0.5 10*3/uL (ref 0.1–0.9)
Monocytes: 9 %
Neutrophils Absolute: 2.5 10*3/uL (ref 1.4–7.0)
Neutrophils: 49 %
Platelets: 289 10*3/uL (ref 150–450)
RBC: 4.62 x10E6/uL (ref 4.14–5.80)
RDW: 11.8 % (ref 11.6–15.4)
WBC: 5.1 10*3/uL (ref 3.4–10.8)

## 2023-04-08 LAB — TSH: TSH: 1.09 u[IU]/mL (ref 0.450–4.500)

## 2023-06-13 ENCOUNTER — Telehealth: Admitting: Nurse Practitioner

## 2023-06-13 ENCOUNTER — Encounter: Payer: Self-pay | Admitting: Nurse Practitioner

## 2023-06-13 DIAGNOSIS — H60501 Unspecified acute noninfective otitis externa, right ear: Secondary | ICD-10-CM | POA: Diagnosis not present

## 2023-06-13 MED ORDER — DOXYCYCLINE HYCLATE 100 MG PO TABS: 100.0000 mg | ORAL_TABLET | Freq: Two times a day (BID) | ORAL | 0 refills | Status: DC

## 2023-06-13 NOTE — Progress Notes (Signed)
 There were no vitals taken for this visit.   Subjective:    Patient ID: Kevin Spears, male    DOB: 21-Apr-1985, 38 y.o.   MRN: [id number]  HPI: Kevin Spears is a 38 y.o. male  Chief Complaint  Patient presents with   Otalgia   EAR PAIN Symptoms of a cold since last Thursday/Friday.  Ear pain started yesterday.  Improved with advil.   Duration: days Involved ear(s): right Severity:  moderate  Quality:  aching Fever: no Otorrhea: no Upper respiratory infection symptoms: no Pruritus: no Hearing loss: no Water immersion no Using Q-tips: no Recurrent otitis media: no Status: worse Treatments attempted: none  Relevant past medical, surgical, family and social history reviewed and updated as indicated. Interim medical history since our last visit reviewed. Allergies and medications reviewed and updated.  Review of Systems  Constitutional:  Negative for fever.  HENT:  Positive for congestion and ear pain.     Per HPI unless specifically indicated above     Objective:     There were no vitals taken for this visit.  Wt Readings from Last 3 Encounters:  04/07/23 175 lb 2 oz (79.4 kg)  01/04/22 179 lb 6.4 oz (81.4 kg)  07/29/21 185 lb (83.9 kg)    Physical Exam Vitals and nursing note reviewed.  Constitutional:      General: He is not in acute distress.    Appearance: He is not ill-appearing.  HENT:     Head: Normocephalic.     Right Ear: Hearing normal.     Left Ear: Hearing normal.     Nose: Nose normal.  Pulmonary:     Effort: Pulmonary effort is normal. No respiratory distress.  Neurological:     Mental Status: He is alert.  Psychiatric:        Mood and Affect: Mood normal.        Behavior: Behavior normal.        Thought Content: Thought content normal.        Judgment: Judgment normal.     Results for orders placed or performed in visit on 04/07/23  CBC with Differential/Platelet   Collection Time: 04/07/23 11:34 AM  Result Value Ref Range    WBC 5.1 3.4 - 10.8 x10E3/uL   RBC 4.62 4.14 - 5.80 x10E6/uL   Hemoglobin 13.7 13.0 - 17.7 g/dL   Hematocrit 08.6 57.8 - 51.0 %   MCV 90 79 - 97 fL   MCH 29.7 26.6 - 33.0 pg   MCHC 33.0 31.5 - 35.7 g/dL   RDW 46.9 62.9 - 52.8 %   Platelets 289 150 - 450 x10E3/uL   Neutrophils 49 Not Estab. %   Lymphs 41 Not Estab. %   Monocytes 9 Not Estab. %   Eos 1 Not Estab. %   Basos 0 Not Estab. %   Neutrophils Absolute 2.5 1.4 - 7.0 x10E3/uL   Lymphocytes Absolute 2.1 0.7 - 3.1 x10E3/uL   Monocytes Absolute 0.5 0.1 - 0.9 x10E3/uL   EOS (ABSOLUTE) 0.1 0.0 - 0.4 x10E3/uL   Basophils Absolute 0.0 0.0 - 0.2 x10E3/uL   Immature Granulocytes 0 Not Estab. %   Immature Grans (Abs) 0.0 0.0 - 0.1 x10E3/uL  Comprehensive metabolic panel   Collection Time: 04/07/23 11:34 AM  Result Value Ref Range   Glucose 79 70 - 99 mg/dL   BUN 15 6 - 24 mg/dL   Creatinine, Ser 4.13 0.76 - 1.27 mg/dL   eGFR 99 >24 MW/NUU/7.25  BUN/Creatinine Ratio 15 9 - 20   Sodium 142 134 - 144 mmol/L   Potassium 5.3 (H) 3.5 - 5.2 mmol/L   Chloride 102 96 - 106 mmol/L   CO2 25 20 - 29 mmol/L   Calcium 10.2 8.7 - 10.2 mg/dL   Total Protein 7.0 6.0 - 8.5 g/dL   Albumin 4.6 4.1 - 5.1 g/dL   Globulin, Total 2.4 1.5 - 4.5 g/dL   Bilirubin Total <2.9 0.0 - 1.2 mg/dL   Alkaline Phosphatase 63 44 - 121 IU/L   AST 23 0 - 40 IU/L   ALT 17 0 - 44 IU/L  Lipid panel   Collection Time: 04/07/23 11:34 AM  Result Value Ref Range   Cholesterol, Total 194 100 - 199 mg/dL   Triglycerides 90 0 - 149 mg/dL   HDL 63 >56 mg/dL   VLDL Cholesterol Cal 16 5 - 40 mg/dL   LDL Chol Calc (NIH) 213 (H) 0 - 99 mg/dL   Chol/HDL Ratio 3.1 0.0 - 5.0 ratio  TSH   Collection Time: 04/07/23 11:34 AM  Result Value Ref Range   TSH 1.090 0.450 - 4.500 uIU/mL      Assessment & Plan:   Problem List Items Addressed This Visit   None Visit Diagnoses       Acute otitis externa of right ear, unspecified type    -  Primary   Will treat with Doxycyline.   Complete course of antibiotics.  FOllow up if not improved.        Follow up plan: No follow-ups on file.  This visit was completed via MyChart due to the restrictions of the COVID-19 pandemic. All issues as above were discussed and addressed. Physical exam was done as above through visual confirmation on MyChart. If it was felt that the patient should be evaluated in the office, they were directed there. The patient verbally consented to this visit. Location of the patient: Home Location of the provider: Office Those involved with this call:  Provider: Aileen Alexanders, NP CMA: Kevin Seat, CMA Front Desk/Registration: [facility] This encounter was conducted via video.  I spent 20 mins dedicated to the care of this patient on the date of this encounter to include previsit review of symptoms, plan of care and follow up, face to face time with the patient, and post visit ordering of testing.

## 2023-07-04 DIAGNOSIS — H6981 Other specified disorders of Eustachian tube, right ear: Secondary | ICD-10-CM | POA: Diagnosis not present

## 2023-07-04 DIAGNOSIS — H663X1 Other chronic suppurative otitis media, right ear: Secondary | ICD-10-CM | POA: Diagnosis not present

## 2023-09-08 DIAGNOSIS — D2272 Melanocytic nevi of left lower limb, including hip: Secondary | ICD-10-CM | POA: Diagnosis not present

## 2023-09-08 DIAGNOSIS — L853 Xerosis cutis: Secondary | ICD-10-CM | POA: Diagnosis not present

## 2023-09-08 DIAGNOSIS — D225 Melanocytic nevi of trunk: Secondary | ICD-10-CM | POA: Diagnosis not present

## 2023-09-08 DIAGNOSIS — Z808 Family history of malignant neoplasm of other organs or systems: Secondary | ICD-10-CM | POA: Diagnosis not present

## 2023-09-08 DIAGNOSIS — L814 Other melanin hyperpigmentation: Secondary | ICD-10-CM | POA: Diagnosis not present

## 2023-09-30 DIAGNOSIS — Z23 Encounter for immunization: Secondary | ICD-10-CM | POA: Diagnosis not present

## 2023-11-16 ENCOUNTER — Ambulatory Visit: Admitting: Internal Medicine

## 2023-11-16 ENCOUNTER — Encounter: Payer: Self-pay | Admitting: Internal Medicine

## 2023-11-16 VITALS — BP 108/70 | HR 51 | Ht 71.0 in | Wt 186.0 lb

## 2023-11-16 DIAGNOSIS — F411 Generalized anxiety disorder: Secondary | ICD-10-CM | POA: Insufficient documentation

## 2023-11-16 MED ORDER — SERTRALINE HCL 50 MG PO TABS: 50.0000 mg | ORAL_TABLET | Freq: Every day | ORAL | 1 refills | Status: DC

## 2023-11-16 NOTE — Assessment & Plan Note (Addendum)
 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 Sertraline  50 mg daily - follow up in one month

## 2023-11-16 NOTE — Progress Notes (Signed)
 Date:  11/16/2023   Name:  Kevin Spears   DOB:  02/01/1985   MRN:  [id number]   Chief Complaint: Anxiety  Anxiety Presents for follow-up visit. Symptoms include excessive worry, insomnia, nervous/anxious behavior and restlessness. Patient reports no chest pain, dizziness, palpitations or shortness of breath. Symptoms occur most days. The severity of symptoms is moderate. The quality of sleep is fair. Nighttime awakenings: several.      Review of Systems  Constitutional:  Negative for fatigue and unexpected weight change.  HENT:  Negative for nosebleeds.   Eyes:  Negative for visual disturbance.  Respiratory:  Negative for cough, chest tightness, shortness of breath and wheezing.   Cardiovascular:  Negative for chest pain, palpitations and leg swelling.  Gastrointestinal:  Negative for abdominal pain, constipation and diarrhea.  Neurological:  Negative for dizziness, weakness, light-headedness and headaches.  Psychiatric/Behavioral:  Positive for sleep disturbance. Negative for dysphoric mood. The patient is nervous/anxious and has insomnia.      Lab Results  Component Value Date   NA 142 04/07/2023   K 5.3 (H) 04/07/2023   CO2 25 04/07/2023   GLUCOSE 79 04/07/2023   BUN 15 04/07/2023   CREATININE 0.99 04/07/2023   CALCIUM 10.2 04/07/2023   EGFR 99 04/07/2023   GFRNONAA 90 05/04/2019   Lab Results  Component Value Date   CHOL 194 04/07/2023   HDL 63 04/07/2023   LDLCALC 115 (H) 04/07/2023   TRIG 90 04/07/2023   CHOLHDL 3.1 04/07/2023   Lab Results  Component Value Date   TSH 1.090 04/07/2023   No results found for: HGBA1C Lab Results  Component Value Date   WBC 5.1 04/07/2023   HGB 13.7 04/07/2023   HCT 41.5 04/07/2023   MCV 90 04/07/2023   PLT 289 04/07/2023   Lab Results  Component Value Date   ALT 17 04/07/2023   AST 23 04/07/2023   ALKPHOS 63 04/07/2023   BILITOT <0.2 04/07/2023   No results found for: MARIEN BOLLS, VD25OH    Patient Active Problem List   Diagnosis Date Noted   GAD (generalized anxiety disorder) 11/16/2023   Environmental and seasonal allergies 03/13/2018   Elevated low-density lipoprotein level 11/19/2014   History of OCD (obsessive compulsive disorder) 11/19/2014    Allergies  Allergen Reactions   Erythromycin Rash   Penicillins Rash    Past Surgical History:  Procedure Laterality Date   EYE MUSCLE SURGERY     WISDOM TOOTH EXTRACTION      Social History   Tobacco Use   Smoking status: Former    Types: Cigars   Smokeless tobacco: Never  Vaping Use   Vaping status: Never Used  Substance Use Topics   Alcohol use: Yes    Alcohol/week: 6.0 standard drinks of alcohol    Types: 2 Cans of beer, 4 Standard drinks or equivalent per week   Drug use: Never     Medication list has been reviewed and updated.  Current Meds  Medication Sig   ibuprofen (ADVIL) 600 MG tablet Take 600 mg by mouth every 6 (six) hours as needed.   sertraline  (ZOLOFT ) 50 MG tablet Take 1 tablet (50 mg total) by mouth daily.       11/16/2023   10:28 AM 06/13/2023    1:07 PM 04/07/2023   11:03 AM 01/04/2022   10:24 AM  GAD 7 : Generalized Anxiety Score  Nervous, Anxious, on Edge 3 0 0 0  Control/stop worrying 3 0 0 0  Worry too much - different things 3 0 0 0  Trouble relaxing 1 0 0 0  Restless 1 0 0 0  Easily annoyed or irritable 1 0 0 1  Afraid - awful might happen 0 0 0 0  Total GAD 7 Score 12 0 0 1  Anxiety Difficulty Very difficult Not difficult at all Not difficult at all Not difficult at all       11/16/2023   10:30 AM 11/16/2023   10:28 AM 06/13/2023    1:07 PM  Depression screen PHQ 2/9  Decreased Interest 0 0 0  Down, Depressed, Hopeless 0 0 0  PHQ - 2 Score 0 0 0  Altered sleeping 3 0 0  Tired, decreased energy 1 0 0  Change in appetite 0 0 0  Feeling bad or failure about yourself  0 0 0  Trouble concentrating 1 0 0  Moving slowly or fidgety/restless 0 0 0  Suicidal  thoughts 0 0 0  PHQ-9 Score 5 0 0  Difficult doing work/chores Somewhat difficult Not difficult at all Not difficult at all    BP Readings from Last 3 Encounters:  11/16/23 108/70  04/07/23 118/78  01/04/22 126/78    Physical Exam Vitals and nursing note reviewed.  Constitutional:      General: He is not in acute distress.    Appearance: Normal appearance. He is well-developed.  HENT:     Head: Normocephalic and atraumatic.  Neck:     Vascular: No carotid bruit.  Cardiovascular:     Rate and Rhythm: Normal rate and regular rhythm.  Pulmonary:     Effort: Pulmonary effort is normal. No respiratory distress.     Breath sounds: No wheezing or rhonchi.  Musculoskeletal:     Cervical back: Normal range of motion.     Right lower leg: No edema.     Left lower leg: No edema.  Lymphadenopathy:     Cervical: No cervical adenopathy.  Skin:    General: Skin is warm and dry.     Capillary Refill: Capillary refill takes less than 2 seconds.     Findings: No rash.  Neurological:     Mental Status: He is alert and oriented to person, place, and time.  Psychiatric:        Mood and Affect: Mood normal.        Behavior: Behavior normal.     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 Sertraline  50 mg daily - follow up in one month      Relevant Medications   sertraline  (ZOLOFT ) 50 MG tablet    Return in about 5 months (around 04/15/2024) for CPX - Dan.    Leita HILARIO Adie, MD [facility] [place]

## 2023-12-14 ENCOUNTER — Encounter: Payer: Self-pay | Admitting: Internal Medicine

## 2023-12-14 ENCOUNTER — Telehealth: Admitting: Internal Medicine

## 2023-12-14 VITALS — Ht 71.0 in | Wt 186.0 lb

## 2023-12-14 DIAGNOSIS — F411 Generalized anxiety disorder: Secondary | ICD-10-CM | POA: Diagnosis not present

## 2023-12-14 MED ORDER — SERTRALINE HCL 100 MG PO TABS: 100.0000 mg | ORAL_TABLET | Freq: Every day | ORAL | 1 refills | Status: AC

## 2023-12-14 NOTE — Assessment & Plan Note (Signed)
 Excellent response to sertraline  50 mg.  He would like to increase the dose and see if that is even better. Will send in Rx for sertraline  100 mg Follow up if needed and in 4 months.

## 2023-12-14 NOTE — Progress Notes (Signed)
 Date:  12/14/2023   Name:  Kevin Spears   DOB:  14-Oct-1985   MRN:  [id number]  This encounter was conducted via video encounter. This platform was deemed appropriate for the issues to be addressed.  The patient was correctly identified.  I advised that I am conducting the visit from a secure room in my office at [facility].  The patient is located at home. The limitations of this form of encounter were discussed with the patient and he/she agreed to proceed.  Some vital signs will be absent.  Chief Complaint: Depression  Anxiety Presents for follow-up visit. Symptoms include excessive worry and nervous/anxious behavior. Patient reports no chest pain, dizziness, nausea, palpitations or shortness of breath. Symptoms occur occasionally. The severity of symptoms is causing significant distress. The quality of sleep is good. Nighttime awakenings: occasional.   Compliance with medications: much improved with Sertraline  50 mg. Treatment side effects: none.    Review of Systems  Constitutional:  Negative for chills, fatigue, fever and unexpected weight change.  Respiratory:  Negative for chest tightness and shortness of breath.   Cardiovascular:  Negative for chest pain and palpitations.  Gastrointestinal:  Negative for constipation, diarrhea and nausea.  Neurological:  Negative for dizziness and headaches.  Psychiatric/Behavioral:  The patient is nervous/anxious.      Lab Results  Component Value Date   NA 142 04/07/2023   K 5.3 (H) 04/07/2023   CO2 25 04/07/2023   GLUCOSE 79 04/07/2023   BUN 15 04/07/2023   CREATININE 0.99 04/07/2023   CALCIUM 10.2 04/07/2023   EGFR 99 04/07/2023   GFRNONAA 90 05/04/2019   Lab Results  Component Value Date   CHOL 194 04/07/2023   HDL 63 04/07/2023   LDLCALC 115 (H) 04/07/2023   TRIG 90 04/07/2023   CHOLHDL 3.1 04/07/2023   Lab Results  Component Value Date   TSH 1.090 04/07/2023   No results found for: HGBA1C Lab Results   Component Value Date   WBC 5.1 04/07/2023   HGB 13.7 04/07/2023   HCT 41.5 04/07/2023   MCV 90 04/07/2023   PLT 289 04/07/2023   Lab Results  Component Value Date   ALT 17 04/07/2023   AST 23 04/07/2023   ALKPHOS 63 04/07/2023   BILITOT <0.2 04/07/2023   No results found for: MARIEN BOLLS, VD25OH   Patient Active Problem List   Diagnosis Date Noted   GAD (generalized anxiety disorder) 11/16/2023   Environmental and seasonal allergies 03/13/2018   Elevated low-density lipoprotein level 11/19/2014   History of OCD (obsessive compulsive disorder) 11/19/2014    Allergies  Allergen Reactions   Erythromycin Rash   Penicillins Rash    Past Surgical History:  Procedure Laterality Date   EYE MUSCLE SURGERY     WISDOM TOOTH EXTRACTION      Social History   Tobacco Use   Smoking status: Former    Types: Cigars   Smokeless tobacco: Never  Vaping Use   Vaping status: Never Used  Substance Use Topics   Alcohol use: Yes    Alcohol/week: 6.0 standard drinks of alcohol    Types: 2 Cans of beer, 4 Standard drinks or equivalent per week   Drug use: Never     Medication list has been reviewed and updated.  Current Meds  Medication Sig   ibuprofen (ADVIL) 600 MG tablet Take 600 mg by mouth every 6 (six) hours as needed.   [DISCONTINUED] sertraline  (ZOLOFT ) 50 MG tablet Take  1 tablet (50 mg total) by mouth daily.       12/14/2023    3:54 PM 11/16/2023   10:28 AM 06/13/2023    1:07 PM 04/07/2023   11:03 AM  GAD 7 : Generalized Anxiety Score  Nervous, Anxious, on Edge 1 3 0 0  Control/stop worrying 1 3 0 0  Worry too much - different things 1 3 0 0  Trouble relaxing 0 1 0 0  Restless 0 1 0 0  Easily annoyed or irritable 0 1 0 0  Afraid - awful might happen 0 0 0 0  Total GAD 7 Score 3 12 0 0  Anxiety Difficulty Not difficult at all Very difficult Not difficult at all Not difficult at all       12/14/2023    3:53 PM 11/16/2023   10:30 AM  11/16/2023   10:28 AM  Depression screen PHQ 2/9  Decreased Interest 0 0 0  Down, Depressed, Hopeless 0 0 0  PHQ - 2 Score 0 0 0  Altered sleeping 1 3 0  Tired, decreased energy 0 1 0  Change in appetite 0 0 0  Feeling bad or failure about yourself  0 0 0  Trouble concentrating 0 1 0  Moving slowly or fidgety/restless 0 0 0  Suicidal thoughts 0 0 0  PHQ-9 Score 1 5  0   Difficult doing work/chores Not difficult at all Somewhat difficult Not difficult at all     Data saved with a previous flowsheet row definition    BP Readings from Last 3 Encounters:  11/16/23 108/70  04/07/23 118/78  01/04/22 126/78    Physical Exam Pulmonary:     Effort: Pulmonary effort is normal.  Neurological:     General: No focal deficit present.     Mental Status: He is alert.     Wt Readings from Last 3 Encounters:  12/14/23 186 lb (84.4 kg)  11/16/23 186 lb (84.4 kg)  04/07/23 175 lb 2 oz (79.4 kg)    Ht 5' 11 (1.803 m)   Wt 186 lb (84.4 kg)   BMI 25.94 kg/m   Assessment and Plan:  Problem List Items Addressed This Visit       Unprioritized   GAD (generalized anxiety disorder) - Primary   Excellent response to sertraline  50 mg.  He would like to increase the dose and see if that is even better. Will send in Rx for sertraline  100 mg Follow up if needed and in 4 months.      Relevant Medications   sertraline  (ZOLOFT ) 100 MG tablet   I spent 10 minutes on this encounter, 95% by video (cut off at the very end and finished up by telephone).  No follow-ups on file.    Leita HILARIO Adie, MD [facility] [place]

## 2024-04-09 ENCOUNTER — Encounter: Admitting: Student

## 2024-04-09 ENCOUNTER — Encounter: Admitting: Physician Assistant
